# Patient Record
Sex: Male | Born: 1950 | Race: White | Hispanic: No | Marital: Single | Smoking: Never smoker
Health system: Southern US, Community
[De-identification: ages and names within clinical notes are randomized; demographics above are authoritative.]

## PROBLEM LIST (undated history)

## (undated) DIAGNOSIS — J45909 Unspecified asthma, uncomplicated: Secondary | ICD-10-CM

## (undated) HISTORY — PX: CHOLECYSTECTOMY: SHX55

## (undated) HISTORY — PX: ROTATOR CUFF REPAIR: SHX139

---

## 2021-05-09 ENCOUNTER — Emergency Department (HOSPITAL_BASED_OUTPATIENT_CLINIC_OR_DEPARTMENT_OTHER): Payer: Medicare Other

## 2021-05-09 ENCOUNTER — Other Ambulatory Visit: Payer: Self-pay

## 2021-05-09 ENCOUNTER — Encounter (HOSPITAL_BASED_OUTPATIENT_CLINIC_OR_DEPARTMENT_OTHER): Payer: Self-pay

## 2021-05-09 ENCOUNTER — Emergency Department (HOSPITAL_BASED_OUTPATIENT_CLINIC_OR_DEPARTMENT_OTHER)
Admission: EM | Admit: 2021-05-09 | Discharge: 2021-05-09 | Disposition: A | Discharge status: Home / Self Care | Payer: Medicare Other | Attending: Emergency Medicine | Admitting: Emergency Medicine

## 2021-05-09 DIAGNOSIS — J45909 Unspecified asthma, uncomplicated: Secondary | ICD-10-CM | POA: Diagnosis not present

## 2021-05-09 DIAGNOSIS — R059 Cough, unspecified: Secondary | ICD-10-CM

## 2021-05-09 DIAGNOSIS — U071 COVID-19: Secondary | ICD-10-CM | POA: Diagnosis not present

## 2021-05-09 DIAGNOSIS — R0602 Shortness of breath: Secondary | ICD-10-CM | POA: Diagnosis present

## 2021-05-09 HISTORY — DX: Unspecified asthma, uncomplicated: J45.909

## 2021-05-09 LAB — COMPREHENSIVE METABOLIC PANEL
ALT: 52 U/L — ABNORMAL HIGH (ref 0–44)
AST: 95 U/L — ABNORMAL HIGH (ref 15–41)
Albumin: 3.5 g/dL (ref 3.5–5.0)
Alkaline Phosphatase: 57 U/L (ref 38–126)
Anion gap: 11 (ref 5–15)
BUN: 21 mg/dL (ref 8–23)
CO2: 21 mmol/L — ABNORMAL LOW (ref 22–32)
Calcium: 8.1 mg/dL — ABNORMAL LOW (ref 8.9–10.3)
Chloride: 102 mmol/L (ref 98–111)
Creatinine, Ser: 0.96 mg/dL (ref 0.61–1.24)
GFR, Estimated: >60 mL/min (ref >60)
Glucose, Bld: 113 mg/dL — ABNORMAL HIGH (ref 70–99)
Potassium: 3.4 mmol/L — ABNORMAL LOW (ref 3.5–5.1)
Sodium: 134 mmol/L — ABNORMAL LOW (ref 135–145)
Total Bilirubin: 0.4 mg/dL (ref 0.3–1.2)
Total Protein: 8 g/dL (ref 6.5–8.1)

## 2021-05-09 LAB — CBC WITH DIFFERENTIAL/PLATELET
Abs Immature Granulocytes: 0.02 10*3/uL (ref 0.00–0.07)
Basophils Absolute: 0 10*3/uL (ref 0.0–0.1)
Basophils Relative: 1 %
Eosinophils Absolute: 0 10*3/uL (ref 0.0–0.5)
Eosinophils Relative: 0 %
HCT: 46.9 % (ref 39.0–52.0)
Hemoglobin: 16.5 g/dL (ref 13.0–17.0)
Immature Granulocytes: 1 %
Lymphocytes Relative: 29 %
Lymphs Abs: 1 10*3/uL (ref 0.7–4.0)
MCH: 32.9 pg (ref 26.0–34.0)
MCHC: 35.2 g/dL (ref 30.0–36.0)
MCV: 93.4 fL (ref 80.0–100.0)
Monocytes Absolute: 0.3 10*3/uL (ref 0.1–1.0)
Monocytes Relative: 8 %
Neutro Abs: 2.3 10*3/uL (ref 1.7–7.7)
Neutrophils Relative %: 61 %
Platelets: 145 10*3/uL — ABNORMAL LOW (ref 150–400)
RBC: 5.02 MIL/uL (ref 4.22–5.81)
RDW: 13.9 % (ref 11.5–15.5)
WBC: 3.6 10*3/uL — ABNORMAL LOW (ref 4.0–10.5)
nRBC: 0 % (ref 0.0–0.2)

## 2021-05-09 LAB — RESP PANEL BY RT-PCR (FLU A&B, COVID) ARPGX2
Influenza A by PCR: NEGATIVE
Influenza B by PCR: NEGATIVE
SARS Coronavirus 2 by RT PCR: POSITIVE — AB

## 2021-05-09 LAB — BRAIN NATRIURETIC PEPTIDE: B Natriuretic Peptide: 11.8 pg/mL (ref 0.0–100.0)

## 2021-05-09 MED ORDER — ALBUTEROL SULFATE HFA 108 (90 BASE) MCG/ACT IN AERS
6.0000 | INHALATION_SPRAY | Freq: Once | RESPIRATORY_TRACT | Status: AC
  Administered 2021-05-09: 6 via RESPIRATORY_TRACT
  Filled 2021-05-09: qty 6.7

## 2021-05-09 MED ORDER — BENZONATATE 200 MG PO CAPS: 200.0000 mg | ORAL_CAPSULE | Freq: Three times a day (TID) | ORAL | 0 refills | Status: DC

## 2021-05-09 MED ORDER — AEROCHAMBER PLUS FLO-VU LARGE MISC
1.0000 | Freq: Once | Status: AC
  Administered 2021-05-09: 1
  Filled 2021-05-09: qty 1

## 2021-05-09 MED ORDER — ALBUTEROL SULFATE HFA 108 (90 BASE) MCG/ACT IN AERS: 1.0000 | INHALATION_SPRAY | Freq: Four times a day (QID) | RESPIRATORY_TRACT | 0 refills | Status: DC | PRN

## 2021-05-09 NOTE — ED Notes (Signed)
RT assessed patient upon arrival to room. Stated he has been more SOB with exertion the past 5 weeks. Out of MDI's from [place]. BBS clear with diminished bases. Labored when getting into bed. SAT 94% on RA

## 2021-05-09 NOTE — ED Triage Notes (Signed)
Pt c/o SOB x 5 weeks-reports occ prod cough-states he is out of inhaler-to tx room via w/c-RT at BS-DOE from w/c to stretcher

## 2021-05-09 NOTE — Progress Notes (Signed)
Predicted PEFR is 550, and he achieved 100 x3.

## 2021-05-09 NOTE — Discharge Instructions (Addendum)
Use inhaler as needed 1 to 2 puffs every 4-6 hours. Take Tessalon as prescribed as needed for cough. Take Mucinex as needed as directed. Follow-up with your primary care provider.  You will need a repeat chest x-ray in 3 to 4 weeks. Return to the emergency room for worsening or concerning symptoms.

## 2021-05-09 NOTE — ED Provider Notes (Signed)
[facility] EMERGENCY DEPARTMENT Provider Note   CSN: [id number] Arrival date & time: 05/09/21  1243     History Chief Complaint  Patient presents with   Shortness of Breath    Thomas Hammond is a 70 y.o. male.  Thomas Hammond is a 70 year old male with a past medical history of asthma who presents today with an asthma exacerbation. 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 He has not been followed by the [facility] or a health care provider since. He states that he has been using Proventil inhalers ([facility] sent a supply after his diagnosis) for the past 5 years and has run out. He endorses a worsening dry cough at rest and with exertion for the past 5 weeks, progressively worsening to the point he just does not feel good today. He states that the cough wakes him up almost every night, reports SHOB lying supine, occasionally moves to a "pallet" of bedding on the floor. He has experienced a similar episode in the past that was not as severe and has never hospitalized for an asthma exacerbation. He denies hemoptysis,, fever and chest pain. He denies ever smoking tobacco.      Past Medical History:  Diagnosis Date   Asthma     There are no problems to display for this patient.   Past Surgical History:  Procedure Laterality Date   CHOLECYSTECTOMY     ROTATOR CUFF REPAIR         No family history on file.  Social History   Tobacco Use   Smoking status: Never   Smokeless tobacco: Never  Vaping Use   Vaping Use: Never used  Substance Use Topics   Alcohol use: Never   Drug use: Never    Home Medications Prior to Admission medications   Medication Sig Start Date End Date Taking? Authorizing Provider  albuterol (VENTOLIN HFA) 108 (90 Base) MCG/ACT inhaler Inhale 1-2 puffs into the lungs every 6 (six) hours as needed for wheezing or shortness of breath. 05/09/21  Yes Jeannie Fend, PA-C    Allergies    Patient has no known  allergies.  Review of Systems   Review of Systems  Constitutional:  Negative for chills, diaphoresis and fever.  HENT:  Negative for congestion and sore throat.   Respiratory:  Positive for cough and shortness of breath.   Cardiovascular:  Negative for chest pain, palpitations and leg swelling.  Gastrointestinal:  Negative for abdominal pain, constipation, diarrhea, nausea and vomiting.  Genitourinary:  Negative for difficulty urinating.  Musculoskeletal:  Negative for arthralgias and myalgias.  Skin:  Negative for rash and wound.  Allergic/Immunologic: Negative for immunocompromised state.  Neurological:  Negative for weakness.  Hematological:  Negative for adenopathy.  Psychiatric/Behavioral:  Negative for confusion.   All other systems reviewed and are negative.  Physical Exam Updated Vital Signs BP 125/76   Pulse 81   Temp 98.6 F (37 C) (Oral)   Resp 18   Ht 5\' 6"  (1.676 m)   Wt 110.2 kg   SpO2 95%   BMI 39.22 kg/m   Physical Exam Vitals and nursing note reviewed.  Constitutional:      General: He is not in acute distress.    Appearance: He is well-developed. He is obese. He is not diaphoretic.  HENT:     Head: Normocephalic and atraumatic.  Cardiovascular:     Rate and Rhythm: Normal rate and  regular rhythm.  Pulmonary:     Effort: Pulmonary effort is normal.     Breath sounds: Examination of the right-lower field reveals decreased breath sounds. Examination of the left-lower field reveals decreased breath sounds. Decreased breath sounds present.  Chest:     Chest wall: No tenderness.  Abdominal:     Palpations: Abdomen is soft.     Tenderness: There is no abdominal tenderness.  Musculoskeletal:     Right lower leg: No tenderness. No edema.     Left lower leg: No tenderness. No edema.  Skin:    General: Skin is warm and dry.     Findings: No erythema or rash.  Neurological:     Mental Status: He is alert and oriented to person, place, and time.   Psychiatric:        Behavior: Behavior normal.    ED Results / Procedures / Treatments   Labs (all labs ordered are listed, but only abnormal results are displayed) Labs Reviewed  RESP PANEL BY RT-PCR (FLU A&B, COVID) ARPGX2 - Abnormal; Notable for the following components:      Result Value   SARS Coronavirus 2 by RT PCR POSITIVE (*)    All other components within normal limits  COMPREHENSIVE METABOLIC PANEL - Abnormal; Notable for the following components:   Sodium 134 (*)    Potassium 3.4 (*)    CO2 21 (*)    Glucose, Bld 113 (*)    Calcium 8.1 (*)    AST 95 (*)    ALT 52 (*)    All other components within normal limits  CBC WITH DIFFERENTIAL/PLATELET - Abnormal; Notable for the following components:   WBC 3.6 (*)    Platelets 145 (*)    All other components within normal limits  BRAIN NATRIURETIC PEPTIDE    EKG EKG Interpretation  Date/Time:  Wednesday May 09 2021 14:37:01 EDT Ventricular Rate:  79 PR Interval:  167 QRS Duration: 95 QT Interval:  487 QTC Calculation: 559 R Axis:   65 Text Interpretation: Sinus rhythm Low voltage, precordial leads Borderline repolarization abnormality Prolonged QT interval Confirmed by Tilden Fossa [phone]) on 05/09/2021 2:42:53 PM  Radiology DG Chest Port 1 View  Result Date: 05/09/2021 CLINICAL DATA:  Shortness of breath with exertion EXAM: PORTABLE CHEST 1 VIEW COMPARISON:  None. FINDINGS: Enlarged cardiac silhouette, likely accentuated by AP technique. Ill-defined interstitial and airspace opacities at the lateral left greater than right lung bases. No large pleural effusion or visible pneumothorax on this single semi erect radiograph. IMPRESSION: 1. Ill-defined interstitial and patchy airspace opacities in the peripheral left greater than right lung bases, potentially pneumonia. Consider correlation with COVID testing. Followup PA and lateral chest X-ray is recommended in 3-4 weeks to ensure resolution. 2. Cardiomegaly.  Electronically Signed   By: Feliberto Harts MD   On: 05/09/2021 14:05    Procedures Procedures   Medications Ordered in ED Medications  AeroChamber Plus Flo-Vu Large MISC 1 each (has no administration in time range)  albuterol (VENTOLIN HFA) 108 (90 Base) MCG/ACT inhaler 6 puff (6 puffs Inhalation Given 05/09/21 1630)    ED Course  I have reviewed the triage vital signs and the nursing notes.  Pertinent labs & imaging results that were available during my care of the patient were reviewed by me and considered in my medical decision making (see chart for details).  Clinical Course as of 05/09/21 1631  Wed May 09, 2021  4835 70 year old male with history of asthma presents  with complaint of shortness of breath with dry cough progressively worsening over the past 5 weeks.  Reports dyspnea on exertion as well as orthopnea.  Denies lower extremity swelling or chest pain. Found to have slightly diminished lung sounds at the bases, no lower extremity edema. Records reviewed, prior peak flow in the low 400s, unchanged today.  O2 sats 92% or greater with ambulation. Chest x-ray shows ill-defined interstitial and patchy airspace opacities in the peripheral left greater than right lung bases, potentially pneumonia, recommends correlation with COVID testing, as well as cardiomegaly. [facility] the past 5 years due to frustration with the [facility] billing system. Plan is to check EKG, add on labs including BNP for potential CHF with reports of DOE and orthopnea.  We will also add COVID test. [LM]  1630 COVID-positive.  States that for the past week his symptoms have been worse, unable to narrow down within the past 5 days to be candidate for Paxlovid. CBC with white blood cell count of 3.6, platelets 145, CMP with mildly elevated LFTs, consistent with COVID infection. Plan is to give albuterol inhaler with spacer prior to discharge with prescription for same.  Strongly  encourage patient to follow-up with a primary care provider, advised she will need a repeat chest x-ray in 3 to 4 weeks.  Given return to ER precautions.  BNP normal at 11.8, doubt CHF. [LM]    Clinical Course User Index [LM] Alden Hipp   MDM Rules/Calculators/A&P                            Final Clinical Impression(s) / ED Diagnoses Final diagnoses:  COVID    Rx / DC Orders ED Discharge Orders          Ordered    albuterol (VENTOLIN HFA) 108 (90 Base) MCG/ACT inhaler  Every 6 hours PRN        05/09/21 1628             Jeannie Fend, PA-C 05/09/21 1632    Rolan Bucco, MD 05/10/21 0840

## 2021-05-09 NOTE — ED Notes (Signed)
Pulse ox with ambulation to the bathroom. SAT 92-93%. PA aware.

## 2021-05-13 ENCOUNTER — Emergency Department (HOSPITAL_BASED_OUTPATIENT_CLINIC_OR_DEPARTMENT_OTHER): Payer: Medicare Other

## 2021-05-13 ENCOUNTER — Encounter (HOSPITAL_BASED_OUTPATIENT_CLINIC_OR_DEPARTMENT_OTHER): Payer: Self-pay | Admitting: Emergency Medicine

## 2021-05-13 ENCOUNTER — Inpatient Hospital Stay (HOSPITAL_BASED_OUTPATIENT_CLINIC_OR_DEPARTMENT_OTHER)
Admission: EM | Admit: 2021-05-13 | Discharge: 2021-05-27 | DRG: 177 | Disposition: A | Discharge status: Home / Self Care | Payer: Medicare Other | Attending: Family Medicine | Admitting: Family Medicine

## 2021-05-13 DIAGNOSIS — J1282 Pneumonia due to coronavirus disease 2019: Secondary | ICD-10-CM | POA: Diagnosis present

## 2021-05-13 DIAGNOSIS — R0902 Hypoxemia: Secondary | ICD-10-CM | POA: Diagnosis not present

## 2021-05-13 DIAGNOSIS — Z79899 Other long term (current) drug therapy: Secondary | ICD-10-CM

## 2021-05-13 DIAGNOSIS — I959 Hypotension, unspecified: Secondary | ICD-10-CM | POA: Diagnosis not present

## 2021-05-13 DIAGNOSIS — R0602 Shortness of breath: Secondary | ICD-10-CM

## 2021-05-13 DIAGNOSIS — U071 COVID-19: Secondary | ICD-10-CM | POA: Diagnosis not present

## 2021-05-13 DIAGNOSIS — Z66 Do not resuscitate: Secondary | ICD-10-CM | POA: Diagnosis present

## 2021-05-13 DIAGNOSIS — E871 Hypo-osmolality and hyponatremia: Secondary | ICD-10-CM | POA: Diagnosis present

## 2021-05-13 DIAGNOSIS — E119 Type 2 diabetes mellitus without complications: Secondary | ICD-10-CM | POA: Diagnosis present

## 2021-05-13 DIAGNOSIS — J4521 Mild intermittent asthma with (acute) exacerbation: Secondary | ICD-10-CM | POA: Diagnosis present

## 2021-05-13 DIAGNOSIS — J9601 Acute respiratory failure with hypoxia: Secondary | ICD-10-CM | POA: Diagnosis present

## 2021-05-13 LAB — CBC WITH DIFFERENTIAL/PLATELET
Abs Immature Granulocytes: 0.04 10*3/uL (ref 0.00–0.07)
Basophils Absolute: 0 10*3/uL (ref 0.0–0.1)
Basophils Relative: 0 %
Eosinophils Absolute: 0 10*3/uL (ref 0.0–0.5)
Eosinophils Relative: 0 %
HCT: 46.2 % (ref 39.0–52.0)
Hemoglobin: 16.3 g/dL (ref 13.0–17.0)
Immature Granulocytes: 1 %
Lymphocytes Relative: 8 %
Lymphs Abs: 0.6 10*3/uL — ABNORMAL LOW (ref 0.7–4.0)
MCH: 32.7 pg (ref 26.0–34.0)
MCHC: 35.3 g/dL (ref 30.0–36.0)
MCV: 92.8 fL (ref 80.0–100.0)
Monocytes Absolute: 0.3 10*3/uL (ref 0.1–1.0)
Monocytes Relative: 4 %
Neutro Abs: 6.2 10*3/uL (ref 1.7–7.7)
Neutrophils Relative %: 87 %
Platelets: 260 10*3/uL (ref 150–400)
RBC: 4.98 MIL/uL (ref 4.22–5.81)
RDW: 13.8 % (ref 11.5–15.5)
WBC: 7.1 10*3/uL (ref 4.0–10.5)
nRBC: 0 % (ref 0.0–0.2)

## 2021-05-13 LAB — BASIC METABOLIC PANEL
Anion gap: 11 (ref 5–15)
BUN: 24 mg/dL — ABNORMAL HIGH (ref 8–23)
CO2: 24 mmol/L (ref 22–32)
Calcium: 8.5 mg/dL — ABNORMAL LOW (ref 8.9–10.3)
Chloride: 99 mmol/L (ref 98–111)
Creatinine, Ser: 1.08 mg/dL (ref 0.61–1.24)
GFR, Estimated: >60 mL/min (ref >60)
Glucose, Bld: 152 mg/dL — ABNORMAL HIGH (ref 70–99)
Potassium: 3.7 mmol/L (ref 3.5–5.1)
Sodium: 134 mmol/L — ABNORMAL LOW (ref 135–145)

## 2021-05-13 LAB — TROPONIN I (HIGH SENSITIVITY): Troponin I (High Sensitivity): 7 ng/L (ref <18)

## 2021-05-13 LAB — D-DIMER, QUANTITATIVE: D-Dimer, Quant: 3.81 ug/mL-FEU — ABNORMAL HIGH (ref 0.00–0.50)

## 2021-05-13 MED ORDER — SODIUM CHLORIDE 0.9 % IV SOLN
100.0000 mg | Freq: Every day | INTRAVENOUS | Status: DC
  Administered 2021-05-15 – 2021-05-17 (×3): 100 mg via INTRAVENOUS
  Filled 2021-05-13 (×3): qty 20

## 2021-05-13 MED ORDER — SODIUM CHLORIDE 0.9 % IV SOLN
100.0000 mg | INTRAVENOUS | Status: AC
  Administered 2021-05-14 (×2): 100 mg via INTRAVENOUS

## 2021-05-13 MED ORDER — ALBUTEROL SULFATE HFA 108 (90 BASE) MCG/ACT IN AERS
8.0000 | INHALATION_SPRAY | Freq: Once | RESPIRATORY_TRACT | Status: AC
  Administered 2021-05-13: 8 via RESPIRATORY_TRACT
  Filled 2021-05-13: qty 6.7

## 2021-05-13 MED ORDER — METHYLPREDNISOLONE SODIUM SUCC 125 MG IJ SOLR
125.0000 mg | Freq: Once | INTRAMUSCULAR | Status: AC
  Administered 2021-05-13: 125 mg via INTRAVENOUS
  Filled 2021-05-13: qty 2

## 2021-05-13 MED ORDER — IOHEXOL 350 MG/ML SOLN
100.0000 mL | Freq: Once | INTRAVENOUS | Status: AC | PRN
  Administered 2021-05-13: 100 mL via INTRAVENOUS

## 2021-05-13 NOTE — ED Triage Notes (Signed)
Pt reports increased difficulty breathing; dx with Covid 05/09/21; current RA sat 87%, unable to speak in complete sentences

## 2021-05-13 NOTE — Plan of Care (Signed)
[facility] will assume care on arrival to accepting facility. Until arrival, care as per EDP. However, [facility] available 24/7 for questions and assistance.  

## 2021-05-13 NOTE — ED Provider Notes (Signed)
[facility] EMERGENCY DEPARTMENT Provider Note   CSN: [id number] Arrival date & time: 05/13/21  2107     History Chief Complaint  Patient presents with   Shortness of Breath    Thomas Hammond is a 70 y.o. male.  The history is provided by the patient and medical records.  Shortness of Breath Thomas Hammond is a 70 y.o. male who presents to the Emergency Department complaining of shortness of breath. He presents the emergency department complaining of shortness of breath. He states that this started five weeks ago. He reports cough, that is minimally productive. No chest pain, hemoptysis, nausea, vomiting, abdominal pain. He was seen in the emergency department a few days ago for similar symptoms and was diagnosed with COVID-19 at that time. He states that he is not feeling any better since hospital discharge. He does state that he has been compliant with prescribed medications.    Past Medical History:  Diagnosis Date   Asthma     Patient Active Problem List   Diagnosis Date Noted   Pneumonia due to COVID-19 virus 05/13/2021    Past Surgical History:  Procedure Laterality Date   CHOLECYSTECTOMY     ROTATOR CUFF REPAIR         No family history on file.  Social History   Tobacco Use   Smoking status: Never   Smokeless tobacco: Never  Vaping Use   Vaping Use: Never used  Substance Use Topics   Alcohol use: Never   Drug use: Never    Home Medications Prior to Admission medications   Medication Sig Start Date End Date Taking? Authorizing Provider  albuterol (VENTOLIN HFA) 108 (90 Base) MCG/ACT inhaler Inhale 1-2 puffs into the lungs every 6 (six) hours as needed for wheezing or shortness of breath. 05/09/21   Jeannie Fend, PA-C  benzonatate (TESSALON) 200 MG capsule Take 1 capsule (200 mg total) by mouth every 8 (eight) hours for 10 days. 05/09/21 05/19/21  Jeannie Fend, PA-C    Allergies    Patient has no known allergies.  Review of Systems   Review  of Systems  Respiratory:  Positive for shortness of breath.   All other systems reviewed and are negative.  Physical Exam Updated Vital Signs BP 125/77 Comment: HFNC 8L  Pulse 84 Comment: HFNC 8L  Temp 99.8 F (37.7 C) (Oral)   Resp (!) 22   Ht 5\' 6"  (1.676 m)   Wt 110.2 kg   SpO2 93% Comment: HFNC 8L  BMI 39.22 kg/m   Physical Exam Vitals and nursing note reviewed.  Constitutional:      Appearance: He is well-developed.  HENT:     Head: Normocephalic and atraumatic.  Cardiovascular:     Rate and Rhythm: Normal rate and regular rhythm.     Heart sounds: No murmur heard. Pulmonary:     Effort: Pulmonary effort is normal. No respiratory distress.     Comments: Decreased air movement in upper lung fields.  Occasional wheeze in the bases Abdominal:     Palpations: Abdomen is soft.     Tenderness: There is no abdominal tenderness. There is no guarding or rebound.  Musculoskeletal:        General: No swelling or tenderness.  Skin:    General: Skin is warm and dry.  Neurological:     Mental Status: He is alert and oriented to person, place, and time.  Psychiatric:        Behavior: Behavior normal.  ED Results / Procedures / Treatments   Labs (all labs ordered are listed, but only abnormal results are displayed) Labs Reviewed  BASIC METABOLIC PANEL - Abnormal; Notable for the following components:      Result Value   Sodium 134 (*)    Glucose, Bld 152 (*)    BUN 24 (*)    Calcium 8.5 (*)    All other components within normal limits  CBC WITH DIFFERENTIAL/PLATELET - Abnormal; Notable for the following components:   Lymphs Abs 0.6 (*)    All other components within normal limits  D-DIMER, QUANTITATIVE - Abnormal; Notable for the following components:   D-Dimer, Quant 3.81 (*)    All other components within normal limits  TROPONIN I (HIGH SENSITIVITY)  TROPONIN I (HIGH SENSITIVITY)    EKG None  Radiology CT Angio Chest PE W/Cm &/Or Wo Cm  Result Date:  05/13/2021 CLINICAL DATA:  Pulmonary embolus suspected with low to intermediate probability. Positive D-dimer. EXAM: CT ANGIOGRAPHY CHEST WITH CONTRAST TECHNIQUE: Multidetector CT imaging of the chest was performed using the standard protocol during bolus administration of intravenous contrast. Multiplanar CT image reconstructions and MIPs were obtained to evaluate the vascular anatomy. CONTRAST:  OMNIPAQUE IOHEXOL 350 MG/ML SOLN COMPARISON:  Chest radiograph 05/13/2021 FINDINGS: Cardiovascular: Good opacification of the central and segmental pulmonary arteries. No focal filling defects. No evidence of significant pulmonary embolus. Motion artifact limits evaluation of the lower lungs. Normal heart size. No pericardial effusions. Normal caliber thoracic aorta. No aortic dissection. Great vessel origins are patent. Mediastinum/Nodes: Esophagus is decompressed. Small esophageal hiatal hernia. Scattered calcified lymph nodes. No significant lymphadenopathy. Thyroid gland is unremarkable. Lungs/Pleura: Diffuse distribution of somewhat patchy airspace disease throughout both lungs with a mostly peripheral distribution pattern. Changes likely to represent multifocal pneumonia. No pleural effusions. No pneumothorax. Upper Abdomen: Surgical absence of the gallbladder. No acute abnormalities demonstrated in the visualized upper abdomen. Musculoskeletal: Degenerative changes in the spine. No destructive bone lesions. Review of the MIP images confirms the above findings. IMPRESSION: 1. No evidence of significant pulmonary embolus. 2. Diffuse airspace disease throughout the lungs with peripheral distribution. Most likely represents multifocal pneumonia. Electronically Signed   By: Burman Nieves M.D.   On: 05/13/2021 23:45   DG Chest Port 1 View  Result Date: 05/13/2021 CLINICAL DATA:  Short of breath for 6 weeks. EXAM: PORTABLE CHEST 1 VIEW COMPARISON:  05/09/2021. FINDINGS: Cardiac silhouette is borderline  enlarged. No mediastinal or hilar masses. Hazy opacities noted in the mid to lower lung zones with associated interstitial prominence. Findings are similar to the prior study. No new lung abnormalities. No convincing pleural effusion and no pneumothorax. Previous left shoulder rotator cuff surgery. Skeletal structures grossly intact. IMPRESSION: 1. No interval change from the study performed 4 days ago. 2. Subtle hazy opacities in the mid to lower lungs, most evident at the left lung base, which may reflect multifocal pneumonia with atypical etiologies including viral infection in the differential. Patient reportedly did test positive for COVID-19. Electronically Signed   By: Amie Portland M.D.   On: 05/13/2021 22:34    Procedures Procedures  CRITICAL CARE Performed by: Tilden Fossa   Total critical care time: 40 minutes  Critical care time was exclusive of separately billable procedures and treating other patients.  Critical care was necessary to treat or prevent imminent or life-threatening deterioration.  Critical care was time spent personally by me on the following activities: development of treatment plan with patient and/or surrogate as well  as nursing, discussions with consultants, evaluation of patient's response to treatment, examination of patient, obtaining history from patient or surrogate, ordering and performing treatments and interventions, ordering and review of laboratory studies, ordering and review of radiographic studies, pulse oximetry and re-evaluation of patient's condition.  Medications Ordered in ED Medications  remdesivir 100 mg in sodium chloride 0.9 % 100 mL IVPB (has no administration in time range)  remdesivir 100 mg in sodium chloride 0.9 % 100 mL IVPB (has no administration in time range)  methylPREDNISolone sodium succinate (SOLU-MEDROL) 125 mg/2 mL injection 125 mg (125 mg Intravenous Given 05/13/21 2208)  albuterol (VENTOLIN HFA) 108 (90 Base) MCG/ACT inhaler  8 puff (8 puffs Inhalation Given 05/13/21 2216)  iohexol (OMNIPAQUE) 350 MG/ML injection 100 mL (100 mLs Intravenous Contrast Given 05/13/21 2338)    ED Course  I have reviewed the triage vital signs and the nursing notes.  Pertinent labs & imaging results that were available during my care of the patient were reviewed by me and considered in my medical decision making (see chart for details).    MDM Rules/Calculators/A&P                         Patient with history of asthma, recently diagnosed with COVID-19 here for evaluation of shortness of breath and cough. He does have decreased air movement and wheezing on evaluation without respiratory distress. He does have significant oxygen requirement up to 6 L nasal cannula to maintain sats of the low 90s. He was treated with albuterol with no significant change in his lung exam. Chest x-ray with multifocal pneumonia, likely viral in origin based on his history and symptoms. His D dimer was elevated and a CTA was obtained, which is negative for PE. Medicine consulted for admission for ongoing treatment for COVID-19 with hypoxia. Patient updated findings of studies recommendation for mission and he is in agreement with plan. Remdesivir started per hospitalist request.  Final Clinical Impression(s) / ED Diagnoses Final diagnoses:  Pneumonia due to COVID-19 virus  Hypoxia    Rx / DC Orders ED Discharge Orders     None        Tilden Fossa, MD 05/14/21 0002

## 2021-05-13 NOTE — ED Notes (Signed)
Placed patient on HFNC [place] @ 6L FIO2 100%. Patient oxygen saturations of 92% Patient tolerating well.

## 2021-05-14 ENCOUNTER — Other Ambulatory Visit: Payer: Self-pay

## 2021-05-14 ENCOUNTER — Encounter (HOSPITAL_COMMUNITY): Payer: Self-pay | Admitting: Internal Medicine

## 2021-05-14 DIAGNOSIS — Z66 Do not resuscitate: Secondary | ICD-10-CM | POA: Diagnosis present

## 2021-05-14 DIAGNOSIS — J1282 Pneumonia due to coronavirus disease 2019: Secondary | ICD-10-CM | POA: Diagnosis present

## 2021-05-14 DIAGNOSIS — U071 COVID-19: Secondary | ICD-10-CM | POA: Diagnosis present

## 2021-05-14 DIAGNOSIS — R7989 Other specified abnormal findings of blood chemistry: Secondary | ICD-10-CM | POA: Diagnosis not present

## 2021-05-14 DIAGNOSIS — E119 Type 2 diabetes mellitus without complications: Secondary | ICD-10-CM | POA: Diagnosis present

## 2021-05-14 DIAGNOSIS — R0902 Hypoxemia: Secondary | ICD-10-CM | POA: Diagnosis present

## 2021-05-14 DIAGNOSIS — J9601 Acute respiratory failure with hypoxia: Secondary | ICD-10-CM | POA: Diagnosis present

## 2021-05-14 DIAGNOSIS — I959 Hypotension, unspecified: Secondary | ICD-10-CM | POA: Diagnosis not present

## 2021-05-14 DIAGNOSIS — J4521 Mild intermittent asthma with (acute) exacerbation: Secondary | ICD-10-CM | POA: Diagnosis present

## 2021-05-14 DIAGNOSIS — E871 Hypo-osmolality and hyponatremia: Secondary | ICD-10-CM | POA: Diagnosis present

## 2021-05-14 DIAGNOSIS — Z79899 Other long term (current) drug therapy: Secondary | ICD-10-CM | POA: Diagnosis not present

## 2021-05-14 LAB — CBC
HCT: 45.5 % (ref 39.0–52.0)
Hemoglobin: 15.7 g/dL (ref 13.0–17.0)
MCH: 32.6 pg (ref 26.0–34.0)
MCHC: 34.5 g/dL (ref 30.0–36.0)
MCV: 94.6 fL (ref 80.0–100.0)
Platelets: 252 10*3/uL (ref 150–400)
RBC: 4.81 MIL/uL (ref 4.22–5.81)
RDW: 14.1 % (ref 11.5–15.5)
WBC: 9.6 10*3/uL (ref 4.0–10.5)
nRBC: 0 % (ref 0.0–0.2)

## 2021-05-14 LAB — MRSA NEXT GEN BY PCR, NASAL: MRSA by PCR Next Gen: NOT DETECTED

## 2021-05-14 LAB — PROCALCITONIN: Procalcitonin: 0.63 ng/mL

## 2021-05-14 LAB — TROPONIN I (HIGH SENSITIVITY): Troponin I (High Sensitivity): 6 ng/L (ref <18)

## 2021-05-14 LAB — CREATININE, SERUM
Creatinine, Ser: 0.9 mg/dL (ref 0.61–1.24)
GFR, Estimated: >60 mL/min (ref >60)

## 2021-05-14 LAB — HIV ANTIBODY (ROUTINE TESTING W REFLEX): HIV Screen 4th Generation wRfx: NONREACTIVE

## 2021-05-14 MED ORDER — ENOXAPARIN SODIUM 60 MG/0.6ML IJ SOSY
55.0000 mg | PREFILLED_SYRINGE | INTRAMUSCULAR | Status: DC
  Administered 2021-05-14: 55 mg via SUBCUTANEOUS
  Filled 2021-05-14: qty 0.55

## 2021-05-14 MED ORDER — HYDROCOD POLST-CPM POLST ER 10-8 MG/5ML PO SUER
5.0000 mL | Freq: Two times a day (BID) | ORAL | Status: DC | PRN
  Administered 2021-05-14 – 2021-05-17 (×4): 5 mL via ORAL
  Filled 2021-05-14 (×4): qty 5

## 2021-05-14 MED ORDER — ASCORBIC ACID 500 MG PO TABS
500.0000 mg | ORAL_TABLET | Freq: Every day | ORAL | Status: DC
  Administered 2021-05-14 – 2021-05-27 (×14): 500 mg via ORAL
  Filled 2021-05-14 (×14): qty 1

## 2021-05-14 MED ORDER — CHLORHEXIDINE GLUCONATE CLOTH 2 % EX PADS
6.0000 | MEDICATED_PAD | Freq: Every day | CUTANEOUS | Status: DC
  Administered 2021-05-14 – 2021-05-21 (×8): 6 via TOPICAL

## 2021-05-14 MED ORDER — GUAIFENESIN-DM 100-10 MG/5ML PO SYRP
10.0000 mL | ORAL_SOLUTION | ORAL | Status: DC | PRN
Start: 2021-05-14 — End: 2021-05-18
  Administered 2021-05-15 – 2021-05-18 (×6): 10 mL via ORAL
  Filled 2021-05-14 (×6): qty 10

## 2021-05-14 MED ORDER — ALBUTEROL SULFATE HFA 108 (90 BASE) MCG/ACT IN AERS
8.0000 | INHALATION_SPRAY | RESPIRATORY_TRACT | Status: DC | PRN
  Administered 2021-05-14 – 2021-05-16 (×2): 8 via RESPIRATORY_TRACT
  Filled 2021-05-14: qty 6.7

## 2021-05-14 MED ORDER — ACETAMINOPHEN 325 MG PO TABS: 650.0000 mg | ORAL_TABLET | Freq: Four times a day (QID) | ORAL | Status: DC | PRN

## 2021-05-14 MED ORDER — ORAL CARE MOUTH RINSE
15.0000 mL | Freq: Two times a day (BID) | OROMUCOSAL | Status: DC
  Administered 2021-05-14 – 2021-05-27 (×25): 15 mL via OROMUCOSAL

## 2021-05-14 MED ORDER — BARICITINIB 2 MG PO TABS
4.0000 mg | ORAL_TABLET | Freq: Every day | ORAL | Status: AC
  Administered 2021-05-14 – 2021-05-27 (×14): 4 mg via ORAL
  Filled 2021-05-14 (×14): qty 2

## 2021-05-14 MED ORDER — IPRATROPIUM-ALBUTEROL 20-100 MCG/ACT IN AERS
1.0000 | INHALATION_SPRAY | Freq: Four times a day (QID) | RESPIRATORY_TRACT | Status: DC
  Administered 2021-05-14 – 2021-05-24 (×37): 1 via RESPIRATORY_TRACT
  Filled 2021-05-14 (×2): qty 4

## 2021-05-14 MED ORDER — METHYLPREDNISOLONE SODIUM SUCC 125 MG IJ SOLR
1.0000 mg/kg | Freq: Two times a day (BID) | INTRAMUSCULAR | Status: AC
  Administered 2021-05-14 – 2021-05-17 (×6): 110 mg via INTRAVENOUS
  Filled 2021-05-14 (×6): qty 2

## 2021-05-14 MED ORDER — ZINC SULFATE 220 (50 ZN) MG PO CAPS
220.0000 mg | ORAL_CAPSULE | Freq: Every day | ORAL | Status: DC
  Administered 2021-05-14 – 2021-05-27 (×14): 220 mg via ORAL
  Filled 2021-05-14 (×14): qty 1

## 2021-05-14 MED ORDER — PREDNISONE 10 MG PO TABS
50.0000 mg | ORAL_TABLET | Freq: Every day | ORAL | Status: DC
  Administered 2021-05-18 – 2021-05-20 (×3): 50 mg via ORAL
  Filled 2021-05-14 (×3): qty 2

## 2021-05-14 NOTE — Progress Notes (Signed)
Patient arrives via EMS on stretcher around 1500 from [facility]. He was on HFNC at 10L and was placed on a Selter HFNC at 10L on arrival to the unit. CHG completed and unit orientation done. Patient educated on call light use to reduce falls and injury during hospitalization. Verbalized understanding of education. Patient was noted to be dropping his oxygen saturation levels with any exertional activity, including even speaking for greater than 2 sentences.   At approx 1700 his o2 was increased to 15L via HFNC selter, and his spo2 continued to fluctuate between 87-91%. RT was notified, and it was recommended to place the patient on a Non-rebreather in addition to the non-heated high flow. After 5 min, the patient's spo2 increased to 94-95%. Work of breathing decreased and patient now appears more comfortable, sitting in the recliner chair in an upright position. Will attempt to titrate as tolerated.

## 2021-05-14 NOTE — ED Notes (Signed)
Called [facility] for report, was told RN would call back after they clarify what room he is going to.

## 2021-05-14 NOTE — H&P (Signed)
History and Physical    Thomas Hammond KCL:[id number] DOB: 08-Apr-1951 DOA: 05/13/2021  PCP: Pcp, No  Patient coming from: Home  Chief Complaint: dyspnea  HPI: Thomas Hammond is a 70 y.o. male with medical history significant of asthma. Presenting with dyspnea. His symptoms began about 5 weeks ago. He had a dry cough and difficulty catching his breath. His symptoms persisted and seemed to worsen at the end of last week when he was having difficulty maintaining sleep. His cough was keeping him up at night. He visited to the ED and was told that he was positive for COVID. He was told he was outside the window for paxlovid use and was given a prescription for an albuterol inhaler w/ spacer. He went him and tried to use the inhaler, but his symptoms worsened over the last couple of days. He decided he needed to return to the ED for evaluation.  ED Course: He was found to be significantly hypoxic. He was started on remdes, steroids, albuterol. A CTA PE was obtained. It was negative for PE. TR[facility] was called for admission.   Review of Systems: Review of systems is otherwise negative for all not mentioned in HPI.   PMHx Past Medical History:  Diagnosis Date   Asthma     PSHx Past Surgical History:  Procedure Laterality Date   CHOLECYSTECTOMY     ROTATOR CUFF REPAIR      SocHx  reports that he has never smoked. He has never used smokeless tobacco. He reports that he does not drink alcohol and does not use drugs.  No Known Allergies  FamHx Reviewed, non-contributory.  Prior to Admission medications   Medication Sig Start Date End Date Taking? Authorizing Provider  albuterol (VENTOLIN HFA) 108 (90 Base) MCG/ACT inhaler Inhale 1-2 puffs into the lungs every 6 (six) hours as needed for wheezing or shortness of breath. 05/09/21   Jeannie Fend, PA-C  benzonatate (TESSALON) 200 MG capsule Take 1 capsule (200 mg total) by mouth every 8 (eight) hours for 10 days. 05/09/21 05/19/21  Jeannie Fend, PA-C     Physical Exam: Vitals:   05/14/21 1230 05/14/21 1239 05/14/21 1300 05/14/21 1337  BP: 99/64 99/64 103/90 124/72  Pulse: 66 62 71 70  Resp: (!) 22 (!) 22 (!) 24   Temp:      TempSrc:      SpO2: 93% 93% 93% 92%  Weight:      Height:        General: 70 y.o. male resting in bed in NAD Eyes: PERRL, normal sclera ENMT: Nares patent w/o discharge, orophaynx clear, dentition normal, ears w/o discharge/lesions/ulcers Neck: Supple, trachea midline Cardiovascular: RRR, +S1, S2, no m/g/r, equal pulses throughout Respiratory: scattered wheeze, increased WOB on 10L HFNC GI: BS+, NDNT, no masses noted, no organomegaly noted MSK: No e/c/c Skin: No rashes, bruises, ulcerations noted Neuro: A&O x 3, no focal deficits Psyc: Appropriate interaction and affect, calm/cooperative  Labs on Admission: I have personally reviewed following labs and imaging studies  CBC: Recent Labs  Lab 05/09/21 1433 05/13/21 2152  WBC 3.6* 7.1  NEUTROABS 2.3 6.2  HGB 16.5 16.3  HCT 46.9 46.2  MCV 93.4 92.8  PLT 145* 260   Basic Metabolic Panel: Recent Labs  Lab 05/09/21 1433 05/13/21 2152  NA 134* 134*  K 3.4* 3.7  CL 102 99  CO2 21* 24  GLUCOSE 113* 152*  BUN 21 24*  CREATININE 0.96 1.08  CALCIUM 8.1* 8.5*   GFR: Estimated  Creatinine Clearance: 75.2 mL/min (by C-G formula based on SCr of 1.08 mg/dL). Liver Function Tests: Recent Labs  Lab 05/09/21 1433  AST 95*  ALT 52*  ALKPHOS 57  BILITOT 0.4  PROT 8.0  ALBUMIN 3.5   No results for input(s): LIPASE, AMYLASE in the last 168 hours. No results for input(s): AMMONIA in the last 168 hours. Coagulation Profile: No results for input(s): INR, PROTIME in the last 168 hours. Cardiac Enzymes: No results for input(s): CKTOTAL, CKMB, CKMBINDEX, TROPONINI in the last 168 hours. BNP (last 3 results) No results for input(s): PROBNP in the last 8760 hours. HbA1C: No results for input(s): HGBA1C in the last 72 hours. CBG: No results for  input(s): GLUCAP in the last 168 hours. Lipid Profile: No results for input(s): CHOL, HDL, LDLCALC, TRIG, CHOLHDL, LDLDIRECT in the last 72 hours. Thyroid Function Tests: No results for input(s): TSH, T4TOTAL, FREET4, T3FREE, THYROIDAB in the last 72 hours. Anemia Panel: No results for input(s): VITAMINB12, FOLATE, FERRITIN, TIBC, IRON, RETICCTPCT in the last 72 hours. Urine analysis: No results found for: COLORURINE, APPEARANCEUR, LABSPEC, PHURINE, GLUCOSEU, HGBUR, BILIRUBINUR, KETONESUR, PROTEINUR, UROBILINOGEN, NITRITE, LEUKOCYTESUR  Radiological Exams on Admission: CT Angio Chest PE W/Cm &/Or Wo Cm  Result Date: 05/13/2021 CLINICAL DATA:  Pulmonary embolus suspected with low to intermediate probability. Positive D-dimer. EXAM: CT ANGIOGRAPHY CHEST WITH CONTRAST TECHNIQUE: Multidetector CT imaging of the chest was performed using the standard protocol during bolus administration of intravenous contrast. Multiplanar CT image reconstructions and MIPs were obtained to evaluate the vascular anatomy. CONTRAST:  OMNIPAQUE IOHEXOL 350 MG/ML SOLN COMPARISON:  Chest radiograph 05/13/2021 FINDINGS: Cardiovascular: Good opacification of the central and segmental pulmonary arteries. No focal filling defects. No evidence of significant pulmonary embolus. Motion artifact limits evaluation of the lower lungs. Normal heart size. No pericardial effusions. Normal caliber thoracic aorta. No aortic dissection. Great vessel origins are patent. Mediastinum/Nodes: Esophagus is decompressed. Small esophageal hiatal hernia. Scattered calcified lymph nodes. No significant lymphadenopathy. Thyroid gland is unremarkable. Lungs/Pleura: Diffuse distribution of somewhat patchy airspace disease throughout both lungs with a mostly peripheral distribution pattern. Changes likely to represent multifocal pneumonia. No pleural effusions. No pneumothorax. Upper Abdomen: Surgical absence of the gallbladder. No acute abnormalities  demonstrated in the visualized upper abdomen. Musculoskeletal: Degenerative changes in the spine. No destructive bone lesions. Review of the MIP images confirms the above findings. IMPRESSION: 1. No evidence of significant pulmonary embolus. 2. Diffuse airspace disease throughout the lungs with peripheral distribution. Most likely represents multifocal pneumonia. Electronically Signed   By: Burman Nieves M.D.   On: 05/13/2021 23:45   DG Chest Port 1 View  Result Date: 05/13/2021 CLINICAL DATA:  Short of breath for 6 weeks. EXAM: PORTABLE CHEST 1 VIEW COMPARISON:  05/09/2021. FINDINGS: Cardiac silhouette is borderline enlarged. No mediastinal or hilar masses. Hazy opacities noted in the mid to lower lung zones with associated interstitial prominence. Findings are similar to the prior study. No new lung abnormalities. No convincing pleural effusion and no pneumothorax. Previous left shoulder rotator cuff surgery. Skeletal structures grossly intact. IMPRESSION: 1. No interval change from the study performed 4 days ago. 2. Subtle hazy opacities in the mid to lower lungs, most evident at the left lung base, which may reflect multifocal pneumonia with atypical etiologies including viral infection in the differential. Patient reportedly did test positive for COVID-19. Electronically Signed   By: Amie Portland M.D.   On: 05/13/2021 22:34    EKG: None obtained in ED.  Assessment/Plan Acute hypoxic respiratory failure COVID 19     - admitted to inpt, SDU     - continue solumedrol, remdes; add baricitinib, anti-tussives, IS, flutter, vitamins, combivent     - prone when able     - on heated HFNC 10L, wean as able     - follow inflammatory markers  Hyponatremia     - mild, follow  DVT prophylaxis: lovenox  Code Status: FULL  Family Communication: None at bedside  Consults called: None   Status is: Inpatient  Remains inpatient appropriate because:Inpatient level of care appropriate due to severity  of illness  Dispo: The patient is from: Home              Anticipated d/c is to: Home              Patient currently is not medically stable to d/c.   Difficult to place patient No  Time spent coordinating admission: 70 minutes  Akina Maish A Charlette Hennings DO [facility]  If 7PM-7AM, please contact night-coverage [email or web address]  05/14/2021, 2:28 PM

## 2021-05-15 DIAGNOSIS — J1282 Pneumonia due to coronavirus disease 2019: Secondary | ICD-10-CM | POA: Diagnosis not present

## 2021-05-15 DIAGNOSIS — U071 COVID-19: Secondary | ICD-10-CM | POA: Diagnosis not present

## 2021-05-15 LAB — COMPREHENSIVE METABOLIC PANEL
ALT: 30 U/L (ref 0–44)
AST: 50 U/L — ABNORMAL HIGH (ref 15–41)
Albumin: 2.8 g/dL — ABNORMAL LOW (ref 3.5–5.0)
Alkaline Phosphatase: 43 U/L (ref 38–126)
Anion gap: 13 (ref 5–15)
BUN: 39 mg/dL — ABNORMAL HIGH (ref 8–23)
CO2: 22 mmol/L (ref 22–32)
Calcium: 8.3 mg/dL — ABNORMAL LOW (ref 8.9–10.3)
Chloride: 101 mmol/L (ref 98–111)
Creatinine, Ser: 0.78 mg/dL (ref 0.61–1.24)
GFR, Estimated: >60 mL/min (ref >60)
Glucose, Bld: 159 mg/dL — ABNORMAL HIGH (ref 70–99)
Potassium: 4 mmol/L (ref 3.5–5.1)
Sodium: 136 mmol/L (ref 135–145)
Total Bilirubin: 0.6 mg/dL (ref 0.3–1.2)
Total Protein: 7.1 g/dL (ref 6.5–8.1)

## 2021-05-15 LAB — CBC WITH DIFFERENTIAL/PLATELET
Abs Immature Granulocytes: 0.08 10*3/uL — ABNORMAL HIGH (ref 0.00–0.07)
Basophils Absolute: 0 10*3/uL (ref 0.0–0.1)
Basophils Relative: 0 %
Eosinophils Absolute: 0 10*3/uL (ref 0.0–0.5)
Eosinophils Relative: 0 %
HCT: 42.8 % (ref 39.0–52.0)
Hemoglobin: 14.6 g/dL (ref 13.0–17.0)
Immature Granulocytes: 1 %
Lymphocytes Relative: 8 %
Lymphs Abs: 0.7 10*3/uL (ref 0.7–4.0)
MCH: 32.4 pg (ref 26.0–34.0)
MCHC: 34.1 g/dL (ref 30.0–36.0)
MCV: 94.9 fL (ref 80.0–100.0)
Monocytes Absolute: 0.2 10*3/uL (ref 0.1–1.0)
Monocytes Relative: 2 %
Neutro Abs: 8 10*3/uL — ABNORMAL HIGH (ref 1.7–7.7)
Neutrophils Relative %: 89 %
Platelets: 258 10*3/uL (ref 150–400)
RBC: 4.51 MIL/uL (ref 4.22–5.81)
RDW: 13.9 % (ref 11.5–15.5)
WBC: 9 10*3/uL (ref 4.0–10.5)
nRBC: 0 % (ref 0.0–0.2)

## 2021-05-15 LAB — MAGNESIUM: Magnesium: 2.6 mg/dL — ABNORMAL HIGH (ref 1.7–2.4)

## 2021-05-15 LAB — PHOSPHORUS: Phosphorus: 4.2 mg/dL (ref 2.5–4.6)

## 2021-05-15 LAB — C-REACTIVE PROTEIN: CRP: 14.4 mg/dL — ABNORMAL HIGH (ref <1.0)

## 2021-05-15 LAB — D-DIMER, QUANTITATIVE: D-Dimer, Quant: 8.65 ug/mL-FEU — ABNORMAL HIGH (ref 0.00–0.50)

## 2021-05-15 MED ORDER — ENOXAPARIN SODIUM 100 MG/ML IJ SOSY
100.0000 mg | PREFILLED_SYRINGE | Freq: Two times a day (BID) | INTRAMUSCULAR | Status: DC
  Administered 2021-05-15 – 2021-05-18 (×8): 100 mg via SUBCUTANEOUS
  Filled 2021-05-15 (×9): qty 1

## 2021-05-15 MED ORDER — ENOXAPARIN SODIUM 80 MG/0.8ML IJ SOSY: 1.0000 mg/kg | PREFILLED_SYRINGE | Freq: Two times a day (BID) | INTRAMUSCULAR | Status: DC

## 2021-05-15 NOTE — Progress Notes (Signed)
    OVERNIGHT PROGRESS REPORT  Notified by RN for patient wish to DNI (Do Not Intubate)  Patient states that he is familiar with the risks and benefits of this and can restate this readily.  He states the wish to be DNI to the RN and to me in person.  Risks and benefits were restated to him and he affirms that he does NOT want to be intubated/on ventilator should he arrive at that need.  He will accept other measures as noted in the order.   Chinita Greenland MSNA MSN ACNPC-AG Acute Care Nurse Practitioner [facility]

## 2021-05-15 NOTE — Progress Notes (Signed)
, Not PROGRESS NOTE    Thomas Hammond  SLH:[id number] DOB: [birth year] DOA: 05/13/2021 PCP: Pcp, No    Brief Narrative:  70 year old gentleman with history of mild intermittent asthma which was recently diagnosed vaccinated against COVID-19 presents to the emergency room with ongoing dyspnea for about 5 weeks, worse for the last few days.  Difficult to sleep.  Cough and fever.  He visited emergency room few days ago, tries albuterol inhaler with no help so came back to the emergency room. In the emergency room he was significantly hypoxic needing 10 L of oxygen.  He was started on steroids albuterol and remdesivir and admitted to the hospital.   Assessment & Plan:   Active Problems:   Pneumonia due to COVID-19 virus  Pneumonia due to COVID-19 virus infection with acute hypoxemic respiratory failure: Continue to monitor due to significant symptoms  chest physiotherapy, incentive spirometry, deep breathing exercises, sputum induction, mucolytic's and bronchodilators. Supplemental oxygen to keep saturations more than 90%. Covid directed therapy with , steroids, on high-dose Solu-Medrol continue remdesivir, day 2/5 Started on baricitinib day 2 today. antibiotics, not indicated Due to severity of symptoms, patient will need daily inflammatory markers, chest x-rays, liver function test to monitor and direct COVID-19 therapies. With elevated D-dimer, CTA of the lung was negative for PE.  Will start patient on presumptive therapeutic Lovenox today.  Mild intermittent asthma: With exacerbation due to above.  Symptomatic treatment.   DVT prophylaxis:   Lovenox subcu   Code Status: Full code Family Communication: None.  Patient is communicating Disposition Plan: Status is: Inpatient  Remains inpatient appropriate because:Inpatient level of care appropriate due to severity of illness  Dispo: The patient is from: Home              Anticipated d/c is to: Home              Patient currently  is not medically stable to d/c.   Difficult to place patient No         Consultants:  None  Procedures:  None  Antimicrobials:  Remdesivir 7/25---   Subjective: Patient seen and examined.  Overnight remained on high flow oxygen, however he himself feels slightly better.  He slept better last night with less cough.  Able to talk in full sentences.  Looks anxious.  Objective: Vitals:   05/15/21 0800 05/15/21 0836 05/15/21 0900 05/15/21 1000  BP:  133/69 (!) 138/58 116/66  Pulse: 62 64 67 61  Resp: (!) 23 (!) 25 (!) 25 16  Temp:      TempSrc:      SpO2: 98% 93% 91% 91%  Weight:      Height:        Intake/Output Summary (Last 24 hours) at 05/15/2021 1039 Last data filed at 05/15/2021 0800 Gross per 24 hour  Intake 840 ml  Output 400 ml  Net 440 ml   Filed Weights   05/13/21 2132  Weight: 110.2 kg    Examination:  General exam: Appears in mild distress with tachypnea but able to talk in complete sentences.  Pleasant to conversation. SpO2: 91 % O2 Flow Rate (L/min): 15 L/min (plus NRBM) FiO2 (%): 100 %  Respiratory system: No added sounds. Cardiovascular system: S1 & S2 heard, RRR.  No edema.   Gastrointestinal system: Abdomen is nondistended, soft and nontender. No organomegaly or masses felt. Normal bowel sounds heard. Central nervous system: Alert and oriented. No focal neurological deficits. Extremities: Symmetric 5 x 5 power. Skin: No  rashes, lesions or ulcers Psychiatry: Judgement and insight appear normal. Mood & affect appropriate.     Data Reviewed: I have personally reviewed following labs and imaging studies  CBC: Recent Labs  Lab 05/09/21 1433 05/13/21 2152 05/14/21 1605 05/15/21 0242  WBC 3.6* 7.1 9.6 9.0  NEUTROABS 2.3 6.2  --  8.0*  HGB 16.5 16.3 15.7 14.6  HCT 46.9 46.2 45.5 42.8  MCV 93.4 92.8 94.6 94.9  PLT 145* 260 252 258   Basic Metabolic Panel: Recent Labs  Lab 05/09/21 1433 05/13/21 2152 05/14/21 1605 05/15/21 0242   NA 134* 134*  --  136  K 3.4* 3.7  --  4.0  CL 102 99  --  101  CO2 21* 24  --  22  GLUCOSE 113* 152*  --  159*  BUN 21 24*  --  39*  CREATININE 0.96 1.08 0.90 0.78  CALCIUM 8.1* 8.5*  --  8.3*  MG  --   --   --  2.6*  PHOS  --   --   --  4.2   GFR: Estimated Creatinine Clearance: 101.6 mL/min (by C-G formula based on SCr of 0.78 mg/dL). Liver Function Tests: Recent Labs  Lab 05/09/21 1433 05/15/21 0242  AST 95* 50*  ALT 52* 30  ALKPHOS 57 43  BILITOT 0.4 0.6  PROT 8.0 7.1  ALBUMIN 3.5 2.8*   No results for input(s): LIPASE, AMYLASE in the last 168 hours. No results for input(s): AMMONIA in the last 168 hours. Coagulation Profile: No results for input(s): INR, PROTIME in the last 168 hours. Cardiac Enzymes: No results for input(s): CKTOTAL, CKMB, CKMBINDEX, TROPONINI in the last 168 hours. BNP (last 3 results) No results for input(s): PROBNP in the last 8760 hours. HbA1C: No results for input(s): HGBA1C in the last 72 hours. CBG: No results for input(s): GLUCAP in the last 168 hours. Lipid Profile: No results for input(s): CHOL, HDL, LDLCALC, TRIG, CHOLHDL, LDLDIRECT in the last 72 hours. Thyroid Function Tests: No results for input(s): TSH, T4TOTAL, FREET4, T3FREE, THYROIDAB in the last 72 hours. Anemia Panel: No results for input(s): VITAMINB12, FOLATE, FERRITIN, TIBC, IRON, RETICCTPCT in the last 72 hours. Sepsis Labs: Recent Labs  Lab 05/14/21 1605  PROCALCITON 0.63    Recent Results (from the past 240 hour(s))  Resp Panel by RT-PCR (Flu A&B, Covid) Nasopharyngeal Swab     Status: Abnormal   Collection Time: 05/09/21  2:33 PM   Specimen: Nasopharyngeal Swab; Nasopharyngeal(NP) swabs in vial transport medium  Result Value Ref Range Status   SARS Coronavirus 2 by RT PCR POSITIVE (A) NEGATIVE Final    Comment: RESULT CALLED TO, READ BACK BY AND VERIFIED WITH: JERRI PEGRAM RN ON 05/09/21 AT 1616 SRC (NOTE) SARS-CoV-2 target nucleic acids are  DETECTED.  The SARS-CoV-2 RNA is generally detectable in upper respiratory specimens during the acute phase of infection. Positive results are indicative of the presence of the identified virus, but do not rule out bacterial infection or co-infection with other pathogens not detected by the test. Clinical correlation with patient history and other diagnostic information is necessary to determine patient infection status. The expected result is Negative.  Fact Sheet for Patients: BloggerCourse.com  Fact Sheet for Healthcare Providers: SeriousBroker.it  This test is not yet approved or cleared by the [place] FDA and  has been authorized for detection and/or diagnosis of SARS-CoV-2 by FDA under an Emergency Use Authorization (EUA).  This EUA will remain in effect (meaning this test  c an be used) for the duration of  the COVID-19 declaration under Section 564(b)(1) of the Act, 21 U.S.C. section 360bbb-3(b)(1), unless the authorization is terminated or revoked sooner.     Influenza A by PCR NEGATIVE NEGATIVE Final   Influenza B by PCR NEGATIVE NEGATIVE Final    Comment: (NOTE) The Xpert Xpress SARS-CoV-2/FLU/RSV plus assay is intended as an aid in the diagnosis of influenza from Nasopharyngeal swab specimens and should not be used as a sole basis for treatment. Nasal washings and aspirates are unacceptable for Xpert Xpress SARS-CoV-2/FLU/RSV testing.  Fact Sheet for Patients: BloggerCourse.com  Fact Sheet for Healthcare Providers: SeriousBroker.it  This test is not yet approved or cleared by the [place] FDA and has been authorized for detection and/or diagnosis of SARS-CoV-2 by FDA under an Emergency Use Authorization (EUA). This EUA will remain in effect (meaning this test can be used) for the duration of the COVID-19 declaration under Section 564(b)(1) of the Act, 21  U.S.C. section 360bbb-3(b)(1), unless the authorization is terminated or revoked.  Performed at [facility], [address] [place], [place] [address]   MRSA Next Gen by PCR, Nasal     Status: None   Collection Time: 05/14/21  4:06 PM   Specimen: Nasal Mucosa; Nasal Swab  Result Value Ref Range Status   MRSA by PCR Next Gen NOT DETECTED NOT DETECTED Final    Comment: (NOTE) The GeneXpert MRSA Assay (FDA approved for NASAL specimens only), is one component of a comprehensive MRSA colonization surveillance program. It is not intended to diagnose MRSA infection nor to guide or monitor treatment for MRSA infections. Test performance is not FDA approved in patients less than 69 years old. Performed at [facility], [address], [place], [place] [address]          Radiology Studies: CT Angio Chest PE W/Cm &/Or Wo Cm  Result Date: 05/13/2021 CLINICAL DATA:  Pulmonary embolus suspected with low to intermediate probability. Positive D-dimer. EXAM: CT ANGIOGRAPHY CHEST WITH CONTRAST TECHNIQUE: Multidetector CT imaging of the chest was performed using the standard protocol during bolus administration of intravenous contrast. Multiplanar CT image reconstructions and MIPs were obtained to evaluate the vascular anatomy. CONTRAST:  OMNIPAQUE IOHEXOL 350 MG/ML SOLN COMPARISON:  Chest radiograph 05/13/2021 FINDINGS: Cardiovascular: Good opacification of the central and segmental pulmonary arteries. No focal filling defects. No evidence of significant pulmonary embolus. Motion artifact limits evaluation of the lower lungs. Normal heart size. No pericardial effusions. Normal caliber thoracic aorta. No aortic dissection. Great vessel origins are patent. Mediastinum/Nodes: Esophagus is decompressed. Small esophageal hiatal hernia. Scattered calcified lymph nodes. No significant lymphadenopathy. Thyroid gland is unremarkable. Lungs/Pleura: Diffuse distribution of  somewhat patchy airspace disease throughout both lungs with a mostly peripheral distribution pattern. Changes likely to represent multifocal pneumonia. No pleural effusions. No pneumothorax. Upper Abdomen: Surgical absence of the gallbladder. No acute abnormalities demonstrated in the visualized upper abdomen. Musculoskeletal: Degenerative changes in the spine. No destructive bone lesions. Review of the MIP images confirms the above findings. IMPRESSION: 1. No evidence of significant pulmonary embolus. 2. Diffuse airspace disease throughout the lungs with peripheral distribution. Most likely represents multifocal pneumonia. Electronically Signed   By: Burman Nieves M.D.   On: 05/13/2021 23:45   DG Chest Port 1 View  Result Date: 05/13/2021 CLINICAL DATA:  Short of breath for 6 weeks. EXAM: PORTABLE CHEST 1 VIEW COMPARISON:  05/09/2021. FINDINGS: Cardiac silhouette is borderline enlarged. No mediastinal or  hilar masses. Hazy opacities noted in the mid to lower lung zones with associated interstitial prominence. Findings are similar to the prior study. No new lung abnormalities. No convincing pleural effusion and no pneumothorax. Previous left shoulder rotator cuff surgery. Skeletal structures grossly intact. IMPRESSION: 1. No interval change from the study performed 4 days ago. 2. Subtle hazy opacities in the mid to lower lungs, most evident at the left lung base, which may reflect multifocal pneumonia with atypical etiologies including viral infection in the differential. Patient reportedly did test positive for COVID-19. Electronically Signed   By: Amie Portlandavid  Ormond M.D.   On: 05/13/2021 22:34        Scheduled Meds:  vitamin C  500 mg Oral Daily   baricitinib  4 mg Oral Daily   Chlorhexidine Gluconate Cloth  6 each Topical Daily   enoxaparin (LOVENOX) injection  1 mg/kg (Adjusted) Subcutaneous Q12H   Ipratropium-Albuterol  1 puff Inhalation Q6H   mouth rinse  15 mL Mouth Rinse BID    methylPREDNISolone (SOLU-MEDROL) injection  1 mg/kg Intravenous Q12H   Followed by   Melene Muller[START ON 05/18/2021] predniSONE  50 mg Oral Daily   zinc sulfate  220 mg Oral Daily   Continuous Infusions:  remdesivir 100 mg in NS 100 mL 100 mg (05/15/21 1024)     LOS: 1 day    Time spent: 35 minutes    Dorcas CarrowKuber Sabreena Vogan, MD [facility] Pager [phone]

## 2021-05-15 NOTE — TOC Initial Note (Signed)
Transition of Care [facility]) - Initial/Assessment Note    Patient Details  Name: Thomas Hammond MRN: [id number] Date of Birth: 12/21/1950  Transition of [facility]) CM/SW Contact:    Golda Acre, RN Phone Number: 05/15/2021, 8:46 AM  Clinical Narrative:                 70 y.o. male with medical history significant of asthma. Presenting with dyspnea. His symptoms began about 5 weeks ago. He had a dry cough and difficulty catching his breath. His symptoms persisted and seemed to worsen at the end of last week when he was having difficulty maintaining sleep. His cough was keeping him up at night. He visited to the ED and was told that he was positive for COVID. He was told he was outside the window for paxlovid use and was given a prescription for an albuterol inhaler w/ spacer. He went him and tried to use the inhaler, but his symptoms worsened over the last couple of days. He decided he needed to return to the ED for evaluation.   ED Course: He was found to be significantly hypoxic. He was started on remdes, steroids, albuterol. A CTA PE was obtained. It was negative for PE. TRH was called for admission.   TOC PLAN OF CARE: Following for progression and toc needs.  Expected Discharge Plan: Home/Self Care Barriers to Discharge: Continued Medical Work up   Patient Goals and CMS Choice Patient states their goals for this hospitalization and ongoing recovery are:: to go home CMS Medicare.gov Compare Post Acute Care list provided to:: Patient    Expected Discharge Plan and Services Expected Discharge Plan: Home/Self Care   Discharge Planning Services: CM Consult   Living arrangements for the past 2 months: Single Family Home                                      Prior Living Arrangements/Services Living arrangements for the past 2 months: Single Family Home Lives with:: Self Patient language and need for interpreter reviewed:: Yes Do you feel safe going back to the place  where you live?: Yes            Criminal Activity/Legal Involvement Pertinent to Current Situation/Hospitalization: No - Comment as needed  Activities of Daily Living Home Assistive Devices/Equipment: None ADL Screening (condition at time of admission) Patient's cognitive ability adequate to safely complete daily activities?: Yes Is the patient deaf or have difficulty hearing?: No Does the patient have difficulty seeing, even when wearing glasses/contacts?: No Does the patient have difficulty concentrating, remembering, or making decisions?: No Patient able to express need for assistance with ADLs?: Yes Does the patient have difficulty dressing or bathing?: No Independently performs ADLs?: Yes (appropriate for developmental age) Does the patient have difficulty walking or climbing stairs?: No Weakness of Legs: None Weakness of Arms/Hands: None  Permission Sought/Granted                  Emotional Assessment Appearance:: Appears stated age Attitude/Demeanor/Rapport: Engaged Affect (typically observed): Calm Orientation: : Oriented to Place, Oriented to Self, Oriented to  Time, Oriented to Situation Alcohol / Substance Use: Not Applicable Psych Involvement: No (comment)  Admission diagnosis:  Hypoxia [R09.02] Pneumonia due to COVID-19 virus [U07.1, J12.82] Patient Active Problem List   Diagnosis Date Noted   Pneumonia due to COVID-19 virus 05/13/2021   PCP:  Pcp, No Pharmacy:  [id number] [place], [place] - [address] [place] [address] Phone: [phone] Fax: [phone]     Social Determinants of Health (SDOH) Interventions    Readmission Risk Interventions No flowsheet data found.

## 2021-05-15 NOTE — Progress Notes (Signed)
Jaiven was sleeping when chaplain came by.  Chaplain will not be able to complete Advanced Directive traditionally with paperwork, witnesses, and notarization in place.  Chaplain will follow-up with more information.     05/15/21 1700  Clinical Encounter Type  Visited With Patient;Patient not available  Visit Type Initial

## 2021-05-16 DIAGNOSIS — U071 COVID-19: Secondary | ICD-10-CM | POA: Diagnosis not present

## 2021-05-16 DIAGNOSIS — J1282 Pneumonia due to coronavirus disease 2019: Secondary | ICD-10-CM | POA: Diagnosis not present

## 2021-05-16 LAB — CBC WITH DIFFERENTIAL/PLATELET
Abs Immature Granulocytes: 0.05 10*3/uL (ref 0.00–0.07)
Basophils Absolute: 0 10*3/uL (ref 0.0–0.1)
Basophils Relative: 0 %
Eosinophils Absolute: 0 10*3/uL (ref 0.0–0.5)
Eosinophils Relative: 0 %
HCT: 44.6 % (ref 39.0–52.0)
Hemoglobin: 15.3 g/dL (ref 13.0–17.0)
Immature Granulocytes: 1 %
Lymphocytes Relative: 6 %
Lymphs Abs: 0.6 10*3/uL — ABNORMAL LOW (ref 0.7–4.0)
MCH: 32.3 pg (ref 26.0–34.0)
MCHC: 34.3 g/dL (ref 30.0–36.0)
MCV: 94.3 fL (ref 80.0–100.0)
Monocytes Absolute: 0.3 10*3/uL (ref 0.1–1.0)
Monocytes Relative: 4 %
Neutro Abs: 8.4 10*3/uL — ABNORMAL HIGH (ref 1.7–7.7)
Neutrophils Relative %: 89 %
Platelets: 303 10*3/uL (ref 150–400)
RBC: 4.73 MIL/uL (ref 4.22–5.81)
RDW: 13.5 % (ref 11.5–15.5)
WBC: 9.4 10*3/uL (ref 4.0–10.5)
nRBC: 0 % (ref 0.0–0.2)

## 2021-05-16 LAB — COMPREHENSIVE METABOLIC PANEL
ALT: 34 U/L (ref 0–44)
AST: 48 U/L — ABNORMAL HIGH (ref 15–41)
Albumin: 2.9 g/dL — ABNORMAL LOW (ref 3.5–5.0)
Alkaline Phosphatase: 59 U/L (ref 38–126)
Anion gap: 13 (ref 5–15)
BUN: 44 mg/dL — ABNORMAL HIGH (ref 8–23)
CO2: 26 mmol/L (ref 22–32)
Calcium: 8.7 mg/dL — ABNORMAL LOW (ref 8.9–10.3)
Chloride: 101 mmol/L (ref 98–111)
Creatinine, Ser: 0.97 mg/dL (ref 0.61–1.24)
GFR, Estimated: >60 mL/min (ref >60)
Glucose, Bld: 177 mg/dL — ABNORMAL HIGH (ref 70–99)
Potassium: 3.9 mmol/L (ref 3.5–5.1)
Sodium: 140 mmol/L (ref 135–145)
Total Bilirubin: 0.7 mg/dL (ref 0.3–1.2)
Total Protein: 7.5 g/dL (ref 6.5–8.1)

## 2021-05-16 LAB — C-REACTIVE PROTEIN: CRP: 5.6 mg/dL — ABNORMAL HIGH (ref <1.0)

## 2021-05-16 LAB — D-DIMER, QUANTITATIVE: D-Dimer, Quant: 4.4 ug/mL-FEU — ABNORMAL HIGH (ref 0.00–0.50)

## 2021-05-16 MED ORDER — PHENOL 1.4 % MT LIQD
1.0000 | OROMUCOSAL | Status: DC | PRN
  Administered 2021-05-16: 1 via OROMUCOSAL
  Filled 2021-05-16: qty 177

## 2021-05-16 NOTE — Progress Notes (Signed)
, Not PROGRESS NOTE    Thomas Hammond  DJT:[id number] DOB: 1951/05/07 DOA: 05/13/2021 PCP: Pcp, No    Brief Narrative:  70 year old gentleman with history of mild intermittent asthma which was recently diagnosed vaccinated against COVID-19 presents to the emergency room with ongoing dyspnea for about 5 weeks, worse for the last few days.  Difficult to sleep.  Cough and fever.  He visited emergency room few days ago, tries albuterol inhaler with no help so came back to the emergency room. In the emergency room he was significantly hypoxic needing 10 L of oxygen.  He was started on steroids albuterol and remdesivir and admitted to the hospital.   Assessment & Plan:   Active Problems:   Pneumonia due to COVID-19 virus  Pneumonia due to COVID-19 virus infection with acute hypoxemic respiratory failure: Patient is still with significant symptoms and hypoxemia.  Currently remains on 15 L high flow oxygen. chest physiotherapy, incentive spirometry, deep breathing exercises, sputum induction, mucolytic's and bronchodilators. Supplemental oxygen to keep saturations more than 90%. Covid directed therapy with , steroids, on high-dose Solu-Medrol continue remdesivir, day 3/5 Started on baricitinib day 3 today.  We will continue for 14 days or until discharge. antibiotics, not indicated Due to severity of symptoms, patient will need daily inflammatory markers, chest x-rays, liver function test to monitor and direct COVID-19 therapies. With elevated D-dimer, CTA of the lung was negative for PE.  Remains on high-dose Lovenox. Inflammation markers and D-dimer trending down.  Mild intermittent asthma: With exacerbation due to above.  Symptomatic treatment.   DVT prophylaxis:   Lovenox subcu   Code Status: DNR. Family Communication: None.  States he will call himself. Disposition Plan: Status is: Inpatient  Remains inpatient appropriate because:Inpatient level of care appropriate due to severity  of illness  Dispo: The patient is from: Home              Anticipated d/c is to: Home              Patient currently is not medically stable to d/c.   Difficult to place patient No         Consultants:  None  Procedures:  None  Antimicrobials:  Remdesivir 7/25---   Subjective: Patient seen and examined.  Overnight had much cough but breathing is better.  Today he is able to eat without any trouble.  Wearing high flow nasal cannula oxygen.  Denies any chest pain.  Objective: Vitals:   05/16/21 1005 05/16/21 1100 05/16/21 1107 05/16/21 1200  BP: 119/69 134/71  (!) 138/55  Pulse: 69 73 70 (!) 59  Resp: (!) 25 (!) 27 17 (!) 23  Temp:      TempSrc:      SpO2: 93% (!) 87% (!) 88% 96%  Weight:      Height:        Intake/Output Summary (Last 24 hours) at 05/16/2021 1249 Last data filed at 05/16/2021 1000 Gross per 24 hour  Intake 820 ml  Output 375 ml  Net 445 ml   Filed Weights   05/13/21 2132  Weight: 110.2 kg    Examination:  General exam: In mild distress.  Mostly able to keep up conversations. SpO2: 96 % O2 Flow Rate (L/min): 15 L/min FiO2 (%): 100 %  Respiratory system: Conducted airway sounds. Cardiovascular system: S1 & S2 heard, RRR.  No edema.   Gastrointestinal system: Abdomen is nondistended, soft and nontender. No organomegaly or masses felt. Normal bowel sounds heard. Central nervous system:  Alert and oriented. No focal neurological deficits. Extremities: Symmetric 5 x 5 power. Skin: No rashes, lesions or ulcers Psychiatry: Judgement and insight appear normal. Mood & affect appropriate.     Data Reviewed: I have personally reviewed following labs and imaging studies  CBC: Recent Labs  Lab 05/09/21 1433 05/13/21 2152 05/14/21 1605 05/15/21 0242 05/16/21 0240  WBC 3.6* 7.1 9.6 9.0 9.4  NEUTROABS 2.3 6.2  --  8.0* 8.4*  HGB 16.5 16.3 15.7 14.6 15.3  HCT 46.9 46.2 45.5 42.8 44.6  MCV 93.4 92.8 94.6 94.9 94.3  PLT 145* 260 252 258 303    Basic Metabolic Panel: Recent Labs  Lab 05/09/21 1433 05/13/21 2152 05/14/21 1605 05/15/21 0242 05/16/21 0240  NA 134* 134*  --  136 140  K 3.4* 3.7  --  4.0 3.9  CL 102 99  --  101 101  CO2 21* 24  --  22 26  GLUCOSE 113* 152*  --  159* 177*  BUN 21 24*  --  39* 44*  CREATININE 0.96 1.08 0.90 0.78 0.97  CALCIUM 8.1* 8.5*  --  8.3* 8.7*  MG  --   --   --  2.6*  --   PHOS  --   --   --  4.2  --    GFR: Estimated Creatinine Clearance: 83.8 mL/min (by C-G formula based on SCr of 0.97 mg/dL). Liver Function Tests: Recent Labs  Lab 05/09/21 1433 05/15/21 0242 05/16/21 0240  AST 95* 50* 48*  ALT 52* 30 34  ALKPHOS 57 43 59  BILITOT 0.4 0.6 0.7  PROT 8.0 7.1 7.5  ALBUMIN 3.5 2.8* 2.9*   No results for input(s): LIPASE, AMYLASE in the last 168 hours. No results for input(s): AMMONIA in the last 168 hours. Coagulation Profile: No results for input(s): INR, PROTIME in the last 168 hours. Cardiac Enzymes: No results for input(s): CKTOTAL, CKMB, CKMBINDEX, TROPONINI in the last 168 hours. BNP (last 3 results) No results for input(s): PROBNP in the last 8760 hours. HbA1C: No results for input(s): HGBA1C in the last 72 hours. CBG: No results for input(s): GLUCAP in the last 168 hours. Lipid Profile: No results for input(s): CHOL, HDL, LDLCALC, TRIG, CHOLHDL, LDLDIRECT in the last 72 hours. Thyroid Function Tests: No results for input(s): TSH, T4TOTAL, FREET4, T3FREE, THYROIDAB in the last 72 hours. Anemia Panel: No results for input(s): VITAMINB12, FOLATE, FERRITIN, TIBC, IRON, RETICCTPCT in the last 72 hours. Sepsis Labs: Recent Labs  Lab 05/14/21 1605  PROCALCITON 0.63    Recent Results (from the past 240 hour(s))  Resp Panel by RT-PCR (Flu A&B, Covid) Nasopharyngeal Swab     Status: Abnormal   Collection Time: 05/09/21  2:33 PM   Specimen: Nasopharyngeal Swab; Nasopharyngeal(NP) swabs in vial transport medium  Result Value Ref Range Status   SARS Coronavirus 2  by RT PCR POSITIVE (A) NEGATIVE Final    Comment: RESULT CALLED TO, READ BACK BY AND VERIFIED WITH: JERRI PEGRAM RN ON 05/09/21 AT 1616 SRC (NOTE) SARS-CoV-2 target nucleic acids are DETECTED.  The SARS-CoV-2 RNA is generally detectable in upper respiratory specimens during the acute phase of infection. Positive results are indicative of the presence of the identified virus, but do not rule out bacterial infection or co-infection with other pathogens not detected by the test. Clinical correlation with patient history and other diagnostic information is necessary to determine patient infection status. The expected result is Negative.  Fact Sheet for Patients: BloggerCourse.com  Fact Sheet  for Healthcare Providers: SeriousBroker.it  This test is not yet approved or cleared by the [place] and  has been authorized for detection and/or diagnosis of SARS-CoV-2 by FDA under an Emergency Use Authorization (EUA).  This EUA will remain in effect (meaning this test c an be used) for the duration of  the COVID-19 declaration under Section 564(b)(1) of the Act, 21 U.S.C. section 360bbb-3(b)(1), unless the authorization is terminated or revoked sooner.     Influenza A by PCR NEGATIVE NEGATIVE Final   Influenza B by PCR NEGATIVE NEGATIVE Final    Comment: (NOTE) The Xpert Xpress SARS-CoV-2/FLU/RSV plus assay is intended as an aid in the diagnosis of influenza from Nasopharyngeal swab specimens and should not be used as a sole basis for treatment. Nasal washings and aspirates are unacceptable for Xpert Xpress SARS-CoV-2/FLU/RSV testing.  Fact Sheet for Patients: BloggerCourse.com  Fact Sheet for Healthcare Providers: SeriousBroker.it  This test is not yet approved or cleared by the [place] FDA and has been authorized for detection and/or diagnosis of SARS-CoV-2 by FDA under  an Emergency Use Authorization (EUA). This EUA will remain in effect (meaning this test can be used) for the duration of the COVID-19 declaration under Section 564(b)(1) of the Act, 21 U.S.C. section 360bbb-3(b)(1), unless the authorization is terminated or revoked.  Performed at [facility], [address] [place], [place] [address]   MRSA Next Gen by PCR, Nasal     Status: None   Collection Time: 05/14/21  4:06 PM   Specimen: Nasal Mucosa; Nasal Swab  Result Value Ref Range Status   MRSA by PCR Next Gen NOT DETECTED NOT DETECTED Final    Comment: (NOTE) The GeneXpert MRSA Assay (FDA approved for NASAL specimens only), is one component of a comprehensive MRSA colonization surveillance program. It is not intended to diagnose MRSA infection nor to guide or monitor treatment for MRSA infections. Test performance is not FDA approved in patients less than 39 years old. Performed at [facility], [address], [place], [place] [address]          Radiology Studies: No results found.      Scheduled Meds:  vitamin C  500 mg Oral Daily   baricitinib  4 mg Oral Daily   Chlorhexidine Gluconate Cloth  6 each Topical Daily   enoxaparin (LOVENOX) injection  100 mg Subcutaneous BID   Ipratropium-Albuterol  1 puff Inhalation Q6H   mouth rinse  15 mL Mouth Rinse BID   methylPREDNISolone (SOLU-MEDROL) injection  1 mg/kg Intravenous Q12H   Followed by   Melene Muller ON 05/18/2021] predniSONE  50 mg Oral Daily   zinc sulfate  220 mg Oral Daily   Continuous Infusions:  remdesivir 100 mg in NS 100 mL Stopped (05/16/21 0952)     LOS: 2 days    Time spent: 32 minutes    Dorcas Carrow, MD [facility] Pager [phone]

## 2021-05-17 ENCOUNTER — Inpatient Hospital Stay (HOSPITAL_COMMUNITY): Payer: Medicare Other

## 2021-05-17 DIAGNOSIS — U071 COVID-19: Secondary | ICD-10-CM | POA: Diagnosis not present

## 2021-05-17 DIAGNOSIS — J1282 Pneumonia due to coronavirus disease 2019: Secondary | ICD-10-CM | POA: Diagnosis not present

## 2021-05-17 LAB — COMPREHENSIVE METABOLIC PANEL
ALT: 50 U/L — ABNORMAL HIGH (ref 0–44)
AST: 70 U/L — ABNORMAL HIGH (ref 15–41)
Albumin: 2.7 g/dL — ABNORMAL LOW (ref 3.5–5.0)
Alkaline Phosphatase: 57 U/L (ref 38–126)
Anion gap: 10 (ref 5–15)
BUN: 37 mg/dL — ABNORMAL HIGH (ref 8–23)
CO2: 26 mmol/L (ref 22–32)
Calcium: 8.3 mg/dL — ABNORMAL LOW (ref 8.9–10.3)
Chloride: 103 mmol/L (ref 98–111)
Creatinine, Ser: 0.88 mg/dL (ref 0.61–1.24)
GFR, Estimated: >60 mL/min (ref >60)
Glucose, Bld: 163 mg/dL — ABNORMAL HIGH (ref 70–99)
Potassium: 4.4 mmol/L (ref 3.5–5.1)
Sodium: 139 mmol/L (ref 135–145)
Total Bilirubin: 0.9 mg/dL (ref 0.3–1.2)
Total Protein: 6.9 g/dL (ref 6.5–8.1)

## 2021-05-17 LAB — CBC WITH DIFFERENTIAL/PLATELET
Abs Immature Granulocytes: 0.05 10*3/uL (ref 0.00–0.07)
Basophils Absolute: 0 10*3/uL (ref 0.0–0.1)
Basophils Relative: 0 %
Eosinophils Absolute: 0 10*3/uL (ref 0.0–0.5)
Eosinophils Relative: 0 %
HCT: 41.3 % (ref 39.0–52.0)
Hemoglobin: 14.1 g/dL (ref 13.0–17.0)
Immature Granulocytes: 1 %
Lymphocytes Relative: 6 %
Lymphs Abs: 0.5 10*3/uL — ABNORMAL LOW (ref 0.7–4.0)
MCH: 32.1 pg (ref 26.0–34.0)
MCHC: 34.1 g/dL (ref 30.0–36.0)
MCV: 94.1 fL (ref 80.0–100.0)
Monocytes Absolute: 0.4 10*3/uL (ref 0.1–1.0)
Monocytes Relative: 5 %
Neutro Abs: 7.1 10*3/uL (ref 1.7–7.7)
Neutrophils Relative %: 88 %
Platelets: 257 10*3/uL (ref 150–400)
RBC: 4.39 MIL/uL (ref 4.22–5.81)
RDW: 13.4 % (ref 11.5–15.5)
WBC: 8 10*3/uL (ref 4.0–10.5)
nRBC: 0 % (ref 0.0–0.2)

## 2021-05-17 LAB — C-REACTIVE PROTEIN: CRP: 2 mg/dL — ABNORMAL HIGH (ref <1.0)

## 2021-05-17 LAB — D-DIMER, QUANTITATIVE: D-Dimer, Quant: 2.66 ug/mL-FEU — ABNORMAL HIGH (ref 0.00–0.50)

## 2021-05-17 MED ORDER — FUROSEMIDE 10 MG/ML IJ SOLN
40.0000 mg | Freq: Once | INTRAMUSCULAR | Status: AC
  Administered 2021-05-17: 40 mg via INTRAVENOUS
  Filled 2021-05-17: qty 4

## 2021-05-17 MED ORDER — SODIUM CHLORIDE 0.9 % IV SOLN
100.0000 mg | Freq: Every day | INTRAVENOUS | Status: AC
  Administered 2021-05-18: 100 mg via INTRAVENOUS
  Filled 2021-05-17: qty 20

## 2021-05-17 NOTE — Plan of Care (Signed)

## 2021-05-17 NOTE — Progress Notes (Signed)
While resting, patient's HR is dropping as low as mid 40's, SB. Previously, the lowest this RN has noted HR was in the 50's. Dr. Jerral Ralph notified by this RN, and per MD, he will order lasix and will continue monitoring.

## 2021-05-17 NOTE — Progress Notes (Signed)
, Not PROGRESS NOTE    Thomas Hammond  RCV:[id number] DOB: [birth year] DOA: 05/13/2021 PCP: Pcp, No    Brief Narrative:  70 year old gentleman with history of mild intermittent asthma which was recently diagnosed vaccinated against COVID-19 presents to the emergency room with ongoing dyspnea for about 5 weeks, worse for the last few days.  Difficult to sleep.  Cough and fever.  He visited emergency room few days ago, tries albuterol inhaler with no help so came back to the emergency room. In the emergency room he was significantly hypoxic needing 10 L of oxygen.  He was started on steroids albuterol and remdesivir and admitted to the hospital. Patient overall clinically in a stable condition, still on very high flow oxygen.   Assessment & Plan:   Active Problems:   Pneumonia due to COVID-19 virus  Pneumonia due to COVID-19 virus infection with acute hypoxemic respiratory failure: Patient is still with significant symptoms and hypoxemia.  Currently remains on 15 L high flow oxygen as well as using nonrebreather. He has desired to be no intubation. chest physiotherapy, incentive spirometry, deep breathing exercises, sputum induction, mucolytic's and bronchodilators. Supplemental oxygen to keep saturations more than 90%. Covid directed therapy with , steroids, on high-dose Solu-Medrol continue remdesivir, day 4/5 Started on baricitinib day 3 today.  We will continue for 14 days or until discharge. antibiotics, not indicated Due to severity of symptoms, patient will need daily inflammatory markers, chest x-rays, liver function test to monitor and direct COVID-19 therapies. With elevated D-dimer, CTA of the lung was negative for PE.  Remains on high-dose Lovenox. Inflammation markers and D-dimer trending down. Repeat chest x-ray today.  Mild intermittent asthma: With exacerbation due to above.  Symptomatic treatment.   DVT prophylaxis:   Lovenox subcu   Code Status: DNR.  No mechanical  intubation. Family Communication: None.  States he will call himself. Disposition Plan: Status is: Inpatient  Remains inpatient appropriate because:Inpatient level of care appropriate due to severity of illness  Dispo: The patient is from: Home              Anticipated d/c is to: Home              Patient currently is not medically stable to d/c.   Difficult to place patient No         Consultants:  None  Procedures:  None  Antimicrobials:  Remdesivir 7/25---   Subjective: Patient seen and examined.  Sitting in the chair and eating lunch.  80 to 86% on 15 L high flow.  Has some trouble taking deep breaths but does not look tired.  He has nonrebreather handy for low oxygen.  Afebrile.  Minimal dry cough.  Objective: Vitals:   05/17/21 0751 05/17/21 0800 05/17/21 0804 05/17/21 0900  BP:   (!) 149/68 (!) 119/52  Pulse:  (!) 59 (!) 57 (!) 54  Resp:  20 (!) 28 (!) 23  Temp: 98 F (36.7 C)     TempSrc: Axillary     SpO2:  91% (!) 89% 92%  Weight:      Height:        Intake/Output Summary (Last 24 hours) at 05/17/2021 1040 Last data filed at 05/16/2021 1727 Gross per 24 hour  Intake 240 ml  Output 200 ml  Net 40 ml   Filed Weights   05/13/21 2132  Weight: 110.2 kg    Examination:  General exam: In mild distress and anxious.  Able to talk in complete sentences.  SpO2: 92 % O2 Flow Rate (L/min): 15 L/min FiO2 (%): 100 %  Respiratory system: Conducted airway sounds. Cardiovascular system: S1 & S2 heard, RRR.  No edema.   Gastrointestinal system: Abdomen is nondistended, soft and nontender. No organomegaly or masses felt. Normal bowel sounds heard. Central nervous system: Alert and oriented. No focal neurological deficits. Extremities: Symmetric 5 x 5 power. Skin: No rashes, lesions or ulcers Psychiatry: Judgement and insight appear normal. Mood & affect appropriate.     Data Reviewed: I have personally reviewed following labs and imaging  studies  CBC: Recent Labs  Lab 05/13/21 2152 05/14/21 1605 05/15/21 0242 05/16/21 0240 05/17/21 0301  WBC 7.1 9.6 9.0 9.4 8.0  NEUTROABS 6.2  --  8.0* 8.4* 7.1  HGB 16.3 15.7 14.6 15.3 14.1  HCT 46.2 45.5 42.8 44.6 41.3  MCV 92.8 94.6 94.9 94.3 94.1  PLT 260 252 258 303 257   Basic Metabolic Panel: Recent Labs  Lab 05/13/21 2152 05/14/21 1605 05/15/21 0242 05/16/21 0240 05/17/21 0301  NA 134*  --  136 140 139  K 3.7  --  4.0 3.9 4.4  CL 99  --  101 101 103  CO2 24  --  22 26 26   GLUCOSE 152*  --  159* 177* 163*  BUN 24*  --  39* 44* 37*  CREATININE 1.08 0.90 0.78 0.97 0.88  CALCIUM 8.5*  --  8.3* 8.7* 8.3*  MG  --   --  2.6*  --   --   PHOS  --   --  4.2  --   --    GFR: Estimated Creatinine Clearance: 92.3 mL/min (by C-G formula based on SCr of 0.88 mg/dL). Liver Function Tests: Recent Labs  Lab 05/15/21 0242 05/16/21 0240 05/17/21 0301  AST 50* 48* 70*  ALT 30 34 50*  ALKPHOS 43 59 57  BILITOT 0.6 0.7 0.9  PROT 7.1 7.5 6.9  ALBUMIN 2.8* 2.9* 2.7*   No results for input(s): LIPASE, AMYLASE in the last 168 hours. No results for input(s): AMMONIA in the last 168 hours. Coagulation Profile: No results for input(s): INR, PROTIME in the last 168 hours. Cardiac Enzymes: No results for input(s): CKTOTAL, CKMB, CKMBINDEX, TROPONINI in the last 168 hours. BNP (last 3 results) No results for input(s): PROBNP in the last 8760 hours. HbA1C: No results for input(s): HGBA1C in the last 72 hours. CBG: No results for input(s): GLUCAP in the last 168 hours. Lipid Profile: No results for input(s): CHOL, HDL, LDLCALC, TRIG, CHOLHDL, LDLDIRECT in the last 72 hours. Thyroid Function Tests: No results for input(s): TSH, T4TOTAL, FREET4, T3FREE, THYROIDAB in the last 72 hours. Anemia Panel: No results for input(s): VITAMINB12, FOLATE, FERRITIN, TIBC, IRON, RETICCTPCT in the last 72 hours. Sepsis Labs: Recent Labs  Lab 05/14/21 1605  PROCALCITON 0.63    Recent  Results (from the past 240 hour(s))  Resp Panel by RT-PCR (Flu A&B, Covid) Nasopharyngeal Swab     Status: Abnormal   Collection Time: 05/09/21  2:33 PM   Specimen: Nasopharyngeal Swab; Nasopharyngeal(NP) swabs in vial transport medium  Result Value Ref Range Status   SARS Coronavirus 2 by RT PCR POSITIVE (A) NEGATIVE Final    Comment: RESULT CALLED TO, READ BACK BY AND VERIFIED WITH: JERRI PEGRAM RN ON 05/09/21 AT 1616 SRC (NOTE) SARS-CoV-2 target nucleic acids are DETECTED.  The SARS-CoV-2 RNA is generally detectable in upper respiratory specimens during the acute phase of infection. Positive results are indicative of the presence  of the identified virus, but do not rule out bacterial infection or co-infection with other pathogens not detected by the test. Clinical correlation with patient history and other diagnostic information is necessary to determine patient infection status. The expected result is Negative.  Fact Sheet for Patients: BloggerCourse.com  Fact Sheet for Healthcare Providers: SeriousBroker.it  This test is not yet approved or cleared by the [place] FDA and  has been authorized for detection and/or diagnosis of SARS-CoV-2 by FDA under an Emergency Use Authorization (EUA).  This EUA will remain in effect (meaning this test c an be used) for the duration of  the COVID-19 declaration under Section 564(b)(1) of the Act, 21 U.S.C. section 360bbb-3(b)(1), unless the authorization is terminated or revoked sooner.     Influenza A by PCR NEGATIVE NEGATIVE Final   Influenza B by PCR NEGATIVE NEGATIVE Final    Comment: (NOTE) The Xpert Xpress SARS-CoV-2/FLU/RSV plus assay is intended as an aid in the diagnosis of influenza from Nasopharyngeal swab specimens and should not be used as a sole basis for treatment. Nasal washings and aspirates are unacceptable for Xpert Xpress SARS-CoV-2/FLU/RSV testing.  Fact Sheet  for Patients: BloggerCourse.com  Fact Sheet for Healthcare Providers: SeriousBroker.it  This test is not yet approved or cleared by the [place] FDA and has been authorized for detection and/or diagnosis of SARS-CoV-2 by FDA under an Emergency Use Authorization (EUA). This EUA will remain in effect (meaning this test can be used) for the duration of the COVID-19 declaration under Section 564(b)(1) of the Act, 21 U.S.C. section 360bbb-3(b)(1), unless the authorization is terminated or revoked.  Performed at [facility], [address] [place], [place] [address]   MRSA Next Gen by PCR, Nasal     Status: None   Collection Time: 05/14/21  4:06 PM   Specimen: Nasal Mucosa; Nasal Swab  Result Value Ref Range Status   MRSA by PCR Next Gen NOT DETECTED NOT DETECTED Final    Comment: (NOTE) The GeneXpert MRSA Assay (FDA approved for NASAL specimens only), is one component of a comprehensive MRSA colonization surveillance program. It is not intended to diagnose MRSA infection nor to guide or monitor treatment for MRSA infections. Test performance is not FDA approved in patients less than 55 years old. Performed at [facility], [address] [place], [place] [address]          Radiology Studies: No results found.      Scheduled Meds:  vitamin C  500 mg Oral Daily   baricitinib  4 mg Oral Daily   Chlorhexidine Gluconate Cloth  6 each Topical Daily   enoxaparin (LOVENOX) injection  100 mg Subcutaneous BID   Ipratropium-Albuterol  1 puff Inhalation Q6H   mouth rinse  15 mL Mouth Rinse BID   [START ON 05/18/2021] predniSONE  50 mg Oral Daily   zinc sulfate  220 mg Oral Daily   Continuous Infusions:  remdesivir 100 mg in NS 100 mL 100 mg (05/17/21 1021)     LOS: 3 days    Time spent: 32 minutes    Dorcas Carrow, MD [facility] Pager [phone]

## 2021-05-17 NOTE — Progress Notes (Signed)
When RN hung AM dose of remdesivir, the prior dose was still hanging on IV pole. The secondary bag appeared to be completely full with clamp still engaged. This RN notified pharmacy that it appeared pt had missed prior dose. Per Weston Brass in pharmacy, he will add a day of remdesivir to make up for presumed missed dose.

## 2021-05-17 NOTE — Plan of Care (Signed)
RN will continue to carefully monitor patient's progression of care plan.  

## 2021-05-18 ENCOUNTER — Inpatient Hospital Stay (HOSPITAL_COMMUNITY): Payer: Medicare Other

## 2021-05-18 DIAGNOSIS — R7989 Other specified abnormal findings of blood chemistry: Secondary | ICD-10-CM

## 2021-05-18 DIAGNOSIS — R0902 Hypoxemia: Secondary | ICD-10-CM | POA: Diagnosis not present

## 2021-05-18 DIAGNOSIS — U071 COVID-19: Secondary | ICD-10-CM

## 2021-05-18 DIAGNOSIS — J1282 Pneumonia due to coronavirus disease 2019: Secondary | ICD-10-CM | POA: Diagnosis not present

## 2021-05-18 LAB — CBC WITH DIFFERENTIAL/PLATELET
Abs Immature Granulocytes: 0.04 10*3/uL (ref 0.00–0.07)
Basophils Absolute: 0 10*3/uL (ref 0.0–0.1)
Basophils Relative: 0 %
Eosinophils Absolute: 0 10*3/uL (ref 0.0–0.5)
Eosinophils Relative: 0 %
HCT: 42.7 % (ref 39.0–52.0)
Hemoglobin: 14.5 g/dL (ref 13.0–17.0)
Immature Granulocytes: 1 %
Lymphocytes Relative: 6 %
Lymphs Abs: 0.6 10*3/uL — ABNORMAL LOW (ref 0.7–4.0)
MCH: 32.6 pg (ref 26.0–34.0)
MCHC: 34 g/dL (ref 30.0–36.0)
MCV: 96 fL (ref 80.0–100.0)
Monocytes Absolute: 0.6 10*3/uL (ref 0.1–1.0)
Monocytes Relative: 7 %
Neutro Abs: 7.6 10*3/uL (ref 1.7–7.7)
Neutrophils Relative %: 86 %
Platelets: 243 10*3/uL (ref 150–400)
RBC: 4.45 MIL/uL (ref 4.22–5.81)
RDW: 13.4 % (ref 11.5–15.5)
WBC: 8.8 10*3/uL (ref 4.0–10.5)
nRBC: 0 % (ref 0.0–0.2)

## 2021-05-18 LAB — COMPREHENSIVE METABOLIC PANEL
ALT: 67 U/L — ABNORMAL HIGH (ref 0–44)
AST: 77 U/L — ABNORMAL HIGH (ref 15–41)
Albumin: 2.8 g/dL — ABNORMAL LOW (ref 3.5–5.0)
Alkaline Phosphatase: 56 U/L (ref 38–126)
Anion gap: 8 (ref 5–15)
BUN: 34 mg/dL — ABNORMAL HIGH (ref 8–23)
CO2: 28 mmol/L (ref 22–32)
Calcium: 8.1 mg/dL — ABNORMAL LOW (ref 8.9–10.3)
Chloride: 102 mmol/L (ref 98–111)
Creatinine, Ser: 0.84 mg/dL (ref 0.61–1.24)
GFR, Estimated: >60 mL/min (ref >60)
Glucose, Bld: 143 mg/dL — ABNORMAL HIGH (ref 70–99)
Potassium: 4.5 mmol/L (ref 3.5–5.1)
Sodium: 138 mmol/L (ref 135–145)
Total Bilirubin: 1 mg/dL (ref 0.3–1.2)
Total Protein: 6.6 g/dL (ref 6.5–8.1)

## 2021-05-18 LAB — C-REACTIVE PROTEIN: CRP: 1 mg/dL — ABNORMAL HIGH (ref <1.0)

## 2021-05-18 LAB — D-DIMER, QUANTITATIVE: D-Dimer, Quant: 2.57 ug/mL-FEU — ABNORMAL HIGH (ref 0.00–0.50)

## 2021-05-18 MED ORDER — GUAIFENESIN ER 600 MG PO TB12
1200.0000 mg | ORAL_TABLET | Freq: Two times a day (BID) | ORAL | Status: DC
  Administered 2021-05-18 – 2021-05-27 (×18): 1200 mg via ORAL
  Filled 2021-05-18 (×18): qty 2

## 2021-05-18 MED ORDER — FUROSEMIDE 10 MG/ML IJ SOLN
40.0000 mg | Freq: Once | INTRAMUSCULAR | Status: AC
  Administered 2021-05-18: 40 mg via INTRAVENOUS
  Filled 2021-05-18: qty 4

## 2021-05-18 NOTE — TOC Progression Note (Signed)
Transition of Care [facility]) - Progression Note    Patient Details  Name: Ko Bardon MRN: [id number] Date of Birth: November 29, 1950  Transition of Care [facility] CM/SW Contact  Golda Acre, RN Phone Number: 05/18/2021, 7:28 AM  Clinical Narrative:    Assessment & Plan:   Active Problems:   Pneumonia due to COVID-19 virus   Pneumonia due to COVID-19 virus infection with acute hypoxemic respiratory failure: Patient is still with significant symptoms and hypoxemia.  Currently remains on 15 L high flow oxygen as well as using nonrebreather. He has desired to be no intubation. chest physiotherapy, incentive spirometry, deep breathing exercises, sputum induction, mucolytic's and bronchodilators. Supplemental oxygen to keep saturations more than 90%. Covid directed therapy with , steroids, on high-dose Solu-Medrol continue remdesivir, day 4/5 Started on baricitinib day 3 today.  We will continue for 14 days or until discharge. antibiotics, not indicated Due to severity of symptoms, patient will need daily inflammatory markers, chest x-rays, liver function test to monitor and direct COVID-19 therapies. With elevated D-dimer, CTA of the lung was negative for PE. Remains on high-dose Lovenox. Inflammation markers and D-dimer trending down. Repeat chest x-ray today.   Mild intermittent asthma: With exacerbation due to above.  Symptomatic treatment. TOC PLKAN OF CARE:  to return to home, iv Remdesivir through [id number].  Expected Discharge Plan: Home/Self Care Barriers to Discharge: Continued Medical Work up  Expected Discharge Plan and Services Expected Discharge Plan: Home/Self Care   Discharge Planning Services: CM Consult   Living arrangements for the past 2 months: Single Family Home                                       Social Determinants of Health (SDOH) Interventions    Readmission Risk Interventions No flowsheet data found.

## 2021-05-18 NOTE — Progress Notes (Signed)
[facility]  PROGRESS NOTE  Thomas Hammond NPY:[id number] DOB: [birth year] DOA: 05/13/2021 PCP: Pcp, No   Brief HPI:   70 year old male with history of mild intermittent asthma who was recently vaccinated against COVID-19, presented to the ED with ongoing dyspnea for 5 weeks, worse for past few days.  He was found to be in hypoxemic respiratory failure requiring 10 L/min of oxygen.  Patient was admitted with acute hypoxemic respiratory failure due to COVID-19 pneumonia.    Subjective   Patient still requiring 15 L oxygen via nonrebreather.  Diuresed well around 1.5 L with Lasix given yesterday.   Assessment/Plan:     Acute hypoxemic respiratory failure -Secondary to COVID-19 pneumonia; slow improvement -CRP is down to 1.0, D-dimer 2.57 -Still requiring 15 L/min HFNC NRB; chest x-ray yesterday showed increased bilateral peripheral predominant heterogenous opacities -Continue remdesivir, day 5/5, prednisone 50 mg daily, baricitinib day 4/14 -Initially started on high-dose Lovenox, CTA chest negative for PE.  Check venous duplex of lower extremities, if negative will switch to DVT prophylaxis dose of Lovenox -We will give additional dose of Lasix 40 mg IV x1 -Continue incentive spirometry, flutter valve, vitamin C, zinc, Combivent inhalation every 6 hours      Scheduled medications:    vitamin C  500 mg Oral Daily   baricitinib  4 mg Oral Daily   Chlorhexidine Gluconate Cloth  6 each Topical Daily   enoxaparin (LOVENOX) injection  100 mg Subcutaneous BID   Ipratropium-Albuterol  1 puff Inhalation Q6H   mouth rinse  15 mL Mouth Rinse BID   predniSONE  50 mg Oral Daily   zinc sulfate  220 mg Oral Daily         Data Reviewed:   CBG:  No results for input(s): GLUCAP in the last 168 hours.  SpO2: 97 % O2 Flow Rate (L/min): 15 L/min (plus NRBM) FiO2 (%): 100 %    Vitals:   05/18/21 1200 05/18/21 1229 05/18/21 1300 05/18/21 1400  BP: (!) 90/43  (!) 99/56 124/65   Pulse: 71  70 63  Resp: 19  (!) 22 (!) 28  Temp:  98.1 F (36.7 C)    TempSrc:  Axillary    SpO2: 92%  92% 97%  Weight:      Height:         Intake/Output Summary (Last 24 hours) at 05/18/2021 1443 Last data filed at 05/18/2021 1200 Gross per 24 hour  Intake 1300 ml  Output 2350 ml  Net -1050 ml    07/27 1901 - 07/29 0700 In: 737 [P.O.:720] Out: 1350 [Urine:1350]  Filed Weights   05/13/21 2132  Weight: 110.2 kg    CBC:  Recent Labs  Lab 05/13/21 2152 05/14/21 1605 05/15/21 0242 05/16/21 0240 05/17/21 0301 05/18/21 0251  WBC 7.1 9.6 9.0 9.4 8.0 8.8  HGB 16.3 15.7 14.6 15.3 14.1 14.5  HCT 46.2 45.5 42.8 44.6 41.3 42.7  PLT 260 252 258 303 257 243  MCV 92.8 94.6 94.9 94.3 94.1 96.0  MCH 32.7 32.6 32.4 32.3 32.1 32.6  MCHC 35.3 34.5 34.1 34.3 34.1 34.0  RDW 13.8 14.1 13.9 13.5 13.4 13.4  LYMPHSABS 0.6*  --  0.7 0.6* 0.5* 0.6*  MONOABS 0.3  --  0.2 0.3 0.4 0.6  EOSABS 0.0  --  0.0 0.0 0.0 0.0  BASOSABS 0.0  --  0.0 0.0 0.0 0.0    Complete metabolic panel:  Recent Labs  Lab 05/13/21 2152 05/14/21 1605 05/15/21 0242 05/16/21 0240 05/17/21 0301  05/18/21 0251  NA 134*  --  136 140 139 138  K 3.7  --  4.0 3.9 4.4 4.5  CL 99  --  101 101 103 102  CO2 24  --  22 26 26 28   GLUCOSE 152*  --  159* 177* 163* 143*  BUN 24*  --  39* 44* 37* 34*  CREATININE 1.08 0.90 0.78 0.97 0.88 0.84  CALCIUM 8.5*  --  8.3* 8.7* 8.3* 8.1*  AST  --   --  50* 48* 70* 77*  ALT  --   --  30 34 50* 67*  ALKPHOS  --   --  43 59 57 56  BILITOT  --   --  0.6 0.7 0.9 1.0  ALBUMIN  --   --  2.8* 2.9* 2.7* 2.8*  MG  --   --  2.6*  --   --   --   CRP  --   --  14.4* 5.6* 2.0* 1.0*  DDIMER 3.81*  --  8.65* 4.40* 2.66* 2.57*  PROCALCITON  --  0.63  --   --   --   --     No results for input(s): LIPASE, AMYLASE in the last 168 hours.  Recent Labs  Lab 05/13/21 2152 05/14/21 1605 05/15/21 0242 05/16/21 0240 05/17/21 0301 05/18/21 0251  CRP  --   --  14.4* 5.6* 2.0* 1.0*   DDIMER 3.81*  --  8.65* 4.40* 2.66* 2.57*  PROCALCITON  --  0.63  --   --   --   --       Coagulation profile No results for input(s): INR, PROTIME in the last 168 hours. Recent Labs    05/17/21 0301 05/18/21 0251  DDIMER 2.66* 2.57*    Cardiac Enzymes No results for input(s): CKTOTAL, CKMB, CKMBINDEX, TROPONINI in the last 168 hours.  ------------------------------------------------------------------------------------------------------------------    Component Value Date/Time   BNP 11.8 05/09/2021 1433     Antibiotics: Anti-infectives (From admission, onward)    Start     Dose/Rate Route Frequency Ordered Stop   05/18/21 1000  remdesivir 100 mg in sodium chloride 0.9 % 100 mL IVPB        100 mg 200 mL/hr over 30 Minutes Intravenous Daily 05/17/21 1203 05/18/21 1042   05/15/21 1000  remdesivir 100 mg in sodium chloride 0.9 % 100 mL IVPB  Status:  Discontinued        100 mg 200 mL/hr over 30 Minutes Intravenous Daily 05/13/21 2353 05/17/21 1204   05/14/21 0000  remdesivir 100 mg in sodium chloride 0.9 % 100 mL IVPB        100 mg 200 mL/hr over 30 Minutes Intravenous Every 30 min 05/13/21 2353 05/14/21 0039        Radiology Reports  DG CHEST PORT 1 VIEW  Result Date: 05/17/2021 CLINICAL DATA:  Shortness of breath, cough, history of COVID EXAM: PORTABLE CHEST 1 VIEW COMPARISON:  May 13, 2021 FINDINGS: The cardiomediastinal silhouette is unchanged in contour. No pleural effusion. No pneumothorax. Increased bilateral peripheral predominant heterogeneous opacities, consistent with the sequela of COVID-19 infection. Visualized abdomen is unremarkable. Multilevel degenerative changes of the thoracic spine. Status post LEFT rotator cuff repair. IMPRESSION: Increased bilateral peripheral predominant heterogeneous opacities, consistent with the sequela of COVID-19 infection. Electronically Signed   By: May 15, 2021 MD   On: 05/17/2021 12:49      DVT prophylaxis:  Lovenox  Code Status: Partial code  Family Communication: No family at bedside  Consultants:   Procedures:     Objective    Physical Examination:  General-appears in no acute distress Heart-S1-S2, regular, no murmur auscultated Lungs-bilateral crackles auscultated at lung bases Abdomen-soft, nontender, no organomegaly Extremities-no edema in the lower extremities Neuro-alert, oriented x3, no focal deficit noted  Status is: Inpatient  Dispo: The patient is from: Home              Anticipated d/c is to: Home versus skilled nursing facility              Anticipated d/c date is: 05/25/2021              Patient currently not stable for discharge  Barrier to discharge-ongoing treatment for acute hypoxemic respiratory failure due to COVID-19 pneumonia  COVID-19 Labs  Recent Labs    05/16/21 0240 05/17/21 0301 05/18/21 0251  DDIMER 4.40* 2.66* 2.57*  CRP 5.6* 2.0* 1.0*    Lab Results  Component Value Date   SARSCOV2NAA POSITIVE (A) 05/09/2021    Microbiology  Recent Results (from the past 240 hour(s))  Resp Panel by RT-PCR (Flu A&B, Covid) Nasopharyngeal Swab     Status: Abnormal   Collection Time: 05/09/21  2:33 PM   Specimen: Nasopharyngeal Swab; Nasopharyngeal(NP) swabs in vial transport medium  Result Value Ref Range Status   SARS Coronavirus 2 by RT PCR POSITIVE (A) NEGATIVE Final    Comment: RESULT CALLED TO, READ BACK BY AND VERIFIED WITH: JERRI PEGRAM RN ON 05/09/21 AT 1616 SRC (NOTE) SARS-CoV-2 target nucleic acids are DETECTED.  The SARS-CoV-2 RNA is generally detectable in upper respiratory specimens during the acute phase of infection. Positive results are indicative of the presence of the identified virus, but do not rule out bacterial infection or co-infection with other pathogens not detected by the test. Clinical correlation with patient history and other diagnostic information is necessary to determine patient infection status. The  expected result is Negative.  Fact Sheet for Patients: BloggerCourse.comhttps://www.fda.gov/media/152166/download  Fact Sheet for Healthcare Providers: SeriousBroker.ithttps://www.fda.gov/media/152162/download  This test is not yet approved or cleared by the [place] FDA and  has been authorized for detection and/or diagnosis of SARS-CoV-2 by FDA under an Emergency Use Authorization (EUA).  This EUA will remain in effect (meaning this test c an be used) for the duration of  the COVID-19 declaration under Section 564(b)(1) of the Act, 21 U.S.C. section 360bbb-3(b)(1), unless the authorization is terminated or revoked sooner.     Influenza A by PCR NEGATIVE NEGATIVE Final   Influenza B by PCR NEGATIVE NEGATIVE Final    Comment: (NOTE) The Xpert Xpress SARS-CoV-2/FLU/RSV plus assay is intended as an aid in the diagnosis of influenza from Nasopharyngeal swab specimens and should not be used as a sole basis for treatment. Nasal washings and aspirates are unacceptable for Xpert Xpress SARS-CoV-2/FLU/RSV testing.  Fact Sheet for Patients: BloggerCourse.comhttps://www.fda.gov/media/152166/download  Fact Sheet for Healthcare Providers: SeriousBroker.ithttps://www.fda.gov/media/152162/download  This test is not yet approved or cleared by the [place] FDA and has been authorized for detection and/or diagnosis of SARS-CoV-2 by FDA under an Emergency Use Authorization (EUA). This EUA will remain in effect (meaning this test can be used) for the duration of the COVID-19 declaration under Section 564(b)(1) of the Act, 21 U.S.C. section 360bbb-3(b)(1), unless the authorization is terminated or revoked.  Performed at [facility] [place], [address] [place], [place] [address]   MRSA Next Gen by PCR, Nasal     Status: None   Collection Time: 05/14/21  4:06 PM   Specimen: Nasal Mucosa; Nasal Swab  Result Value Ref Range Status   MRSA by PCR Next Gen NOT DETECTED NOT DETECTED Final    Comment: (NOTE) The GeneXpert MRSA Assay (FDA  approved for NASAL specimens only), is one component of a comprehensive MRSA colonization surveillance program. It is not intended to diagnose MRSA infection nor to guide or monitor treatment for MRSA infections. Test performance is not FDA approved in patients less than 63 years old. Performed at [facility], [address] [place], [place] [address]              Meredeth Ide   [facility] If 7PM-7AM, please contact night-coverage at [email or web address], Office  [phone]   05/18/2021, 2:43 PM  LOS: 4 days

## 2021-05-18 NOTE — Progress Notes (Signed)
Bilateral lower extremity venous duplex completed. Refer to "CV Proc" under chart review to view preliminary results.  05/18/2021 4:05 PM Eula Fried., MHA, RVT, RDCS, RDMS

## 2021-05-19 DIAGNOSIS — R0902 Hypoxemia: Secondary | ICD-10-CM | POA: Diagnosis not present

## 2021-05-19 DIAGNOSIS — J1282 Pneumonia due to coronavirus disease 2019: Secondary | ICD-10-CM | POA: Diagnosis not present

## 2021-05-19 DIAGNOSIS — U071 COVID-19: Secondary | ICD-10-CM | POA: Diagnosis not present

## 2021-05-19 LAB — D-DIMER, QUANTITATIVE: D-Dimer, Quant: 2.54 ug/mL-FEU — ABNORMAL HIGH (ref 0.00–0.50)

## 2021-05-19 LAB — CBC WITH DIFFERENTIAL/PLATELET
Abs Immature Granulocytes: 0.08 10*3/uL — ABNORMAL HIGH (ref 0.00–0.07)
Basophils Absolute: 0 10*3/uL (ref 0.0–0.1)
Basophils Relative: 0 %
Eosinophils Absolute: 0 10*3/uL (ref 0.0–0.5)
Eosinophils Relative: 1 %
HCT: 42.9 % (ref 39.0–52.0)
Hemoglobin: 14.7 g/dL (ref 13.0–17.0)
Immature Granulocytes: 1 %
Lymphocytes Relative: 10 %
Lymphs Abs: 0.8 10*3/uL (ref 0.7–4.0)
MCH: 32.7 pg (ref 26.0–34.0)
MCHC: 34.3 g/dL (ref 30.0–36.0)
MCV: 95.3 fL (ref 80.0–100.0)
Monocytes Absolute: 0.2 10*3/uL (ref 0.1–1.0)
Monocytes Relative: 3 %
Neutro Abs: 6.4 10*3/uL (ref 1.7–7.7)
Neutrophils Relative %: 85 %
Platelets: 260 10*3/uL (ref 150–400)
RBC: 4.5 MIL/uL (ref 4.22–5.81)
RDW: 13.2 % (ref 11.5–15.5)
WBC: 7.6 10*3/uL (ref 4.0–10.5)
nRBC: 0 % (ref 0.0–0.2)

## 2021-05-19 LAB — COMPREHENSIVE METABOLIC PANEL
ALT: 87 U/L — ABNORMAL HIGH (ref 0–44)
AST: 76 U/L — ABNORMAL HIGH (ref 15–41)
Albumin: 2.8 g/dL — ABNORMAL LOW (ref 3.5–5.0)
Alkaline Phosphatase: 54 U/L (ref 38–126)
Anion gap: 9 (ref 5–15)
BUN: 30 mg/dL — ABNORMAL HIGH (ref 8–23)
CO2: 28 mmol/L (ref 22–32)
Calcium: 8.1 mg/dL — ABNORMAL LOW (ref 8.9–10.3)
Chloride: 101 mmol/L (ref 98–111)
Creatinine, Ser: 0.77 mg/dL (ref 0.61–1.24)
GFR, Estimated: >60 mL/min (ref >60)
Glucose, Bld: 101 mg/dL — ABNORMAL HIGH (ref 70–99)
Potassium: 4.3 mmol/L (ref 3.5–5.1)
Sodium: 138 mmol/L (ref 135–145)
Total Bilirubin: 1 mg/dL (ref 0.3–1.2)
Total Protein: 6.6 g/dL (ref 6.5–8.1)

## 2021-05-19 LAB — C-REACTIVE PROTEIN: CRP: 1.2 mg/dL — ABNORMAL HIGH (ref <1.0)

## 2021-05-19 MED ORDER — BENZONATATE 100 MG PO CAPS
200.0000 mg | ORAL_CAPSULE | Freq: Once | ORAL | Status: AC
  Administered 2021-05-19: 200 mg via ORAL
  Filled 2021-05-19: qty 2

## 2021-05-19 MED ORDER — ENOXAPARIN SODIUM 60 MG/0.6ML IJ SOSY
0.5000 mg/kg | PREFILLED_SYRINGE | INTRAMUSCULAR | Status: DC
  Administered 2021-05-19: 55 mg via SUBCUTANEOUS
  Filled 2021-05-19: qty 0.55

## 2021-05-19 NOTE — Progress Notes (Signed)
[facility]  PROGRESS NOTE  Thomas CongoMark Hammond AOZ:[id number]:[id number] DOB: [birth year] DOA: 05/13/2021 PCP: Pcp, No   Brief HPI:   70 year old male with history of mild intermittent asthma who was recently vaccinated against COVID-19, presented to the ED with ongoing dyspnea for 5 weeks, worse for past few days.  He was found to be in hypoxemic respiratory failure requiring 10 L/min of oxygen.  Patient was admitted with acute hypoxemic respiratory failure due to COVID-19 pneumonia.    Subjective   Patient seen and examined, breathing has improved a little as per patient.  He diuresed well with IV Lasix around 2 L.   Assessment/Plan:     Acute hypoxemic respiratory failure -Secondary to COVID-19 pneumonia; slow improvement -CRP is down to 1.2, D-dimer 2.54 -Still requiring 15 L/min HFNC NRB; chest x-ray yesterday showed increased bilateral peripheral predominant heterogenous opacities -He was given IV Lasix 40 mg IV for last 2 days and has diuresed well. -We will repeat chest x-ray in a.m. -Continue remdesivir, day 5/5, prednisone 50 mg daily, baricitinib day 4/14 -Initially started on high-dose Lovenox, CTA chest negative for PE.  Venous duplex of lower extremities was negative for DVT.  Lovenox dose has been switched to DVT prophylaxis dose.   -Continue incentive spirometry, flutter valve, vitamin C, zinc, Combivent inhalation every 6 hours      Scheduled medications:    vitamin C  500 mg Oral Daily   baricitinib  4 mg Oral Daily   Chlorhexidine Gluconate Cloth  6 each Topical Daily   enoxaparin (LOVENOX) injection  0.5 mg/kg Subcutaneous Q24H   guaiFENesin  1,200 mg Oral BID   Ipratropium-Albuterol  1 puff Inhalation Q6H   mouth rinse  15 mL Mouth Rinse BID   predniSONE  50 mg Oral Daily   zinc sulfate  220 mg Oral Daily         Data Reviewed:   CBG:  No results for input(s): GLUCAP in the last 168 hours.  SpO2: 96 % O2 Flow Rate (L/min): 15 L/min FiO2 (%): 100 %     Vitals:   05/19/21 0900 05/19/21 1000 05/19/21 1100 05/19/21 1200  BP:    111/69  Pulse: 78 82 76 71  Resp: (!) 26 (!) 28 (!) 22 (!) 27  Temp:    98.3 F (36.8 C)  TempSrc:    Oral  SpO2: 95% 93% 93% 96%  Weight:      Height:         Intake/Output Summary (Last 24 hours) at 05/19/2021 1340 Last data filed at 05/19/2021 0800 Gross per 24 hour  Intake 240 ml  Output 1000 ml  Net -760 ml    07/28 1901 - 07/30 0700 In: 820 [P.O.:720] Out: 2400 [Urine:2400]  Filed Weights   05/13/21 2132  Weight: 110.2 kg    CBC:  Recent Labs  Lab 05/15/21 0242 05/16/21 0240 05/17/21 0301 05/18/21 0251 05/19/21 0243  WBC 9.0 9.4 8.0 8.8 7.6  HGB 14.6 15.3 14.1 14.5 14.7  HCT 42.8 44.6 41.3 42.7 42.9  PLT 258 303 257 243 260  MCV 94.9 94.3 94.1 96.0 95.3  MCH 32.4 32.3 32.1 32.6 32.7  MCHC 34.1 34.3 34.1 34.0 34.3  RDW 13.9 13.5 13.4 13.4 13.2  LYMPHSABS 0.7 0.6* 0.5* 0.6* 0.8  MONOABS 0.2 0.3 0.4 0.6 0.2  EOSABS 0.0 0.0 0.0 0.0 0.0  BASOSABS 0.0 0.0 0.0 0.0 0.0    Complete metabolic panel:  Recent Labs  Lab 05/14/21 1605 05/15/21 0242 05/16/21  0240 05/17/21 0301 05/18/21 0251 05/19/21 0243  NA  --  136 140 139 138 138  K  --  4.0 3.9 4.4 4.5 4.3  CL  --  101 101 103 102 101  CO2  --  GLUCOSE  --  159* 177* 163* 143* 101*  BUN  --  39* 44* 37* 34* 30*  CREATININE 0.90 0.78 0.97 0.88 0.84 0.77  CALCIUM  --  8.3* 8.7* 8.3* 8.1* 8.1*  AST  --  50* 48* 70* 77* 76*  ALT  --  30 34 50* 67* 87*  ALKPHOS  --  43 59 57 56 54  BILITOT  --  0.6 0.7 0.9 1.0 1.0  ALBUMIN  --  2.8* 2.9* 2.7* 2.8* 2.8*  MG  --  2.6*  --   --   --   --   CRP  --  14.4* 5.6* 2.0* 1.0* 1.2*  DDIMER  --  8.65* 4.40* 2.66* 2.57* 2.54*  PROCALCITON 0.63  --   --   --   --   --     No results for input(s): LIPASE, AMYLASE in the last 168 hours.  Recent Labs  Lab 05/14/21 1605 05/15/21 0242 05/16/21 0240 05/17/21 0301 05/18/21 0251 05/19/21 0243  CRP  --  14.4* 5.6*  2.0* 1.0* 1.2*  DDIMER  --  8.65* 4.40* 2.66* 2.57* 2.54*  PROCALCITON 0.63  --   --   --   --   --       Coagulation profile No results for input(s): INR, PROTIME in the last 168 hours. Recent Labs    05/18/21 0251 05/19/21 0243  DDIMER 2.57* 2.54*    Cardiac Enzymes No results for input(s): CKTOTAL, CKMB, CKMBINDEX, TROPONINI in the last 168 hours.  ------------------------------------------------------------------------------------------------------------------    Component Value Date/Time   BNP 11.8 05/09/2021 1433     Antibiotics: Anti-infectives (From admission, onward)    Start     Dose/Rate Route Frequency Ordered Stop   05/18/21 1000  remdesivir 100 mg in sodium chloride 0.9 % 100 mL IVPB        100 mg 200 mL/hr over 30 Minutes Intravenous Daily 05/17/21 1203 05/18/21 1042   05/15/21 1000  remdesivir 100 mg in sodium chloride 0.9 % 100 mL IVPB  Status:  Discontinued        100 mg 200 mL/hr over 30 Minutes Intravenous Daily 05/13/21 2353 05/17/21 1204   05/14/21 0000  remdesivir 100 mg in sodium chloride 0.9 % 100 mL IVPB        100 mg 200 mL/hr over 30 Minutes Intravenous Every 30 min 05/13/21 2353 05/14/21 0039        Radiology Reports  VAS [place] LOWER EXTREMITY VENOUS (DVT)  Result Date: 05/18/2021  Lower Venous DVT Study Patient Name:  Thomas Hammond  Date of Exam:   05/18/2021 Medical Rec #: [id number]    Accession #:    [id number] Date of Birth: Feb 14, 1951   Patient Gender: M Patient Age:   69Y Exam Location:  [facility] Procedure:      VAS [place] LOWER EXTREMITY VENOUS (DVT) Referring Phys: Gomez Cleverly Izzabelle Bouley --------------------------------------------------------------------------------  Indications: Elevated d-dimer, COVID.  Comparison Study: No prior study Performing Technologist: Gertie Fey MHA, RDMS, RVT, RDCS  Examination Guidelines: A complete evaluation includes B-mode imaging, spectral Doppler, color Doppler, and power Doppler as needed of  all accessible portions of each vessel. Bilateral testing is considered an integral  part of a complete examination. Limited examinations for reoccurring indications may be performed as noted. The reflux portion of the exam is performed with the patient in reverse Trendelenburg.  +---------+---------------+---------+-----------+----------+--------------+ RIGHT    CompressibilityPhasicitySpontaneityPropertiesThrombus Aging +---------+---------------+---------+-----------+----------+--------------+ CFV      Full           Yes      Yes                                 +---------+---------------+---------+-----------+----------+--------------+ SFJ      Full                                                        +---------+---------------+---------+-----------+----------+--------------+ FV Prox  Full                                                        +---------+---------------+---------+-----------+----------+--------------+ FV Mid   Full                                                        +---------+---------------+---------+-----------+----------+--------------+ FV DistalFull                                                        +---------+---------------+---------+-----------+----------+--------------+ PFV      Full                                                        +---------+---------------+---------+-----------+----------+--------------+ POP      Full           Yes      Yes                                 +---------+---------------+---------+-----------+----------+--------------+ PTV      Full                                                        +---------+---------------+---------+-----------+----------+--------------+ PERO     Full                                                        +---------+---------------+---------+-----------+----------+--------------+    +---------+---------------+---------+-----------+----------+--------------+ LEFT     CompressibilityPhasicitySpontaneityPropertiesThrombus Aging +---------+---------------+---------+-----------+----------+--------------+ CFV  Full           Yes      Yes                                 +---------+---------------+---------+-----------+----------+--------------+ SFJ      Full                                                        +---------+---------------+---------+-----------+----------+--------------+ FV Prox  Full                                                        +---------+---------------+---------+-----------+----------+--------------+ FV Mid   Full                                                        +---------+---------------+---------+-----------+----------+--------------+ FV DistalFull                                                        +---------+---------------+---------+-----------+----------+--------------+ PFV      Full                                                        +---------+---------------+---------+-----------+----------+--------------+ POP      Full           Yes      Yes                                 +---------+---------------+---------+-----------+----------+--------------+ PTV      Full                                                        +---------+---------------+---------+-----------+----------+--------------+ PERO     Full                                                        +---------+---------------+---------+-----------+----------+--------------+     Summary: RIGHT: - There is no evidence of deep vein thrombosis in the lower extremity.  - No cystic structure found in the popliteal fossa.  LEFT: - There is no evidence of deep vein thrombosis in the lower extremity.  - No cystic structure found in the popliteal fossa.  *See table(s) above for measurements and observations.  Electronically signed  by Waverly Ferrari MD on 05/18/2021 at 5:23:14 PM.    Final       DVT prophylaxis: Lovenox  Code Status: Partial code  Family Communication: No family at bedside   Consultants:   Procedures:     Objective    Physical Examination:  General-appears in no acute distress Heart-S1-S2, regular, no murmur auscultated Lungs-clear to auscultation bilaterally Abdomen-soft, nontender, no organomegaly Extremities-no edema in the lower extremities Neuro-alert, oriented x3, no focal deficit noted  Status is: Inpatient  Dispo: The patient is from: Home              Anticipated d/c is to: Home versus skilled nursing facility              Anticipated d/c date is: 05/25/2021              Patient currently not stable for discharge  Barrier to discharge-ongoing treatment for acute hypoxemic respiratory failure due to COVID-19 pneumonia  COVID-19 Labs  Recent Labs    05/17/21 0301 05/18/21 0251 05/19/21 0243  DDIMER 2.66* 2.57* 2.54*  CRP 2.0* 1.0* 1.2*    Lab Results  Component Value Date   SARSCOV2NAA POSITIVE (A) 05/09/2021    Microbiology  Recent Results (from the past 240 hour(s))  Resp Panel by RT-PCR (Flu A&B, Covid) Nasopharyngeal Swab     Status: Abnormal   Collection Time: 05/09/21  2:33 PM   Specimen: Nasopharyngeal Swab; Nasopharyngeal(NP) swabs in vial transport medium  Result Value Ref Range Status   SARS Coronavirus 2 by RT PCR POSITIVE (A) NEGATIVE Final    Comment: RESULT CALLED TO, READ BACK BY AND VERIFIED WITH: JERRI PEGRAM RN ON 05/09/21 AT 1616 SRC (NOTE) SARS-CoV-2 target nucleic acids are DETECTED.  The SARS-CoV-2 RNA is generally detectable in upper respiratory specimens during the acute phase of infection. Positive results are indicative of the presence of the identified virus, but do not rule out bacterial infection or co-infection with other pathogens not detected by the test. Clinical correlation with patient history and other  diagnostic information is necessary to determine patient infection status. The expected result is Negative.  Fact Sheet for Patients: BloggerCourse.com  Fact Sheet for Healthcare Providers: SeriousBroker.it  This test is not yet approved or cleared by the [place] FDA and  has been authorized for detection and/or diagnosis of SARS-CoV-2 by FDA under an Emergency Use Authorization (EUA).  This EUA will remain in effect (meaning this test c an be used) for the duration of  the COVID-19 declaration under Section 564(b)(1) of the Act, 21 U.S.C. section 360bbb-3(b)(1), unless the authorization is terminated or revoked sooner.     Influenza A by PCR NEGATIVE NEGATIVE Final   Influenza B by PCR NEGATIVE NEGATIVE Final    Comment: (NOTE) The Xpert Xpress SARS-CoV-2/FLU/RSV plus assay is intended as an aid in the diagnosis of influenza from Nasopharyngeal swab specimens and should not be used as a sole basis for treatment. Nasal washings and aspirates are unacceptable for Xpert Xpress SARS-CoV-2/FLU/RSV testing.  Fact Sheet for Patients: BloggerCourse.com  Fact Sheet for Healthcare Providers: SeriousBroker.it  This test is not yet approved or cleared by the [place] FDA and has been authorized for detection and/or diagnosis of SARS-CoV-2 by FDA under an Emergency Use Authorization (EUA). This EUA will remain in effect (meaning this test can be used) for the duration of the COVID-19 declaration under Section 564(b)(1) of the Act, 21 U.S.C. section 360bbb-3(b)(1), unless the authorization is  terminated or revoked.  Performed at [facility], [address] [place], [place] [address]   MRSA Next Gen by PCR, Nasal     Status: None   Collection Time: 05/14/21  4:06 PM   Specimen: Nasal Mucosa; Nasal Swab  Result Value Ref Range Status   MRSA by PCR Next Gen NOT  DETECTED NOT DETECTED Final    Comment: (NOTE) The GeneXpert MRSA Assay (FDA approved for NASAL specimens only), is one component of a comprehensive MRSA colonization surveillance program. It is not intended to diagnose MRSA infection nor to guide or monitor treatment for MRSA infections. Test performance is not FDA approved in patients less than 17 years old. Performed at [facility], [address], [place], [place] [address]              Meredeth Ide   [facility] If 7PM-7AM, please contact night-coverage at [email or web address], Office  [phone]   05/19/2021, 1:40 PM  LOS: 5 days

## 2021-05-20 DIAGNOSIS — U071 COVID-19: Secondary | ICD-10-CM | POA: Diagnosis not present

## 2021-05-20 DIAGNOSIS — J1282 Pneumonia due to coronavirus disease 2019: Secondary | ICD-10-CM | POA: Diagnosis not present

## 2021-05-20 LAB — BASIC METABOLIC PANEL
Anion gap: 6 (ref 5–15)
BUN: 21 mg/dL (ref 8–23)
CO2: 31 mmol/L (ref 22–32)
Calcium: 8.2 mg/dL — ABNORMAL LOW (ref 8.9–10.3)
Chloride: 97 mmol/L — ABNORMAL LOW (ref 98–111)
Creatinine, Ser: 0.88 mg/dL (ref 0.61–1.24)
GFR, Estimated: >60 mL/min (ref >60)
Glucose, Bld: 97 mg/dL (ref 70–99)
Potassium: 4.5 mmol/L (ref 3.5–5.1)
Sodium: 134 mmol/L — ABNORMAL LOW (ref 135–145)

## 2021-05-20 LAB — BRAIN NATRIURETIC PEPTIDE
B Natriuretic Peptide: 41.1 pg/mL (ref 0.0–100.0)
B Natriuretic Peptide: 23.5 pg/mL (ref 0.0–100.0)

## 2021-05-20 LAB — MAGNESIUM: Magnesium: 2.4 mg/dL (ref 1.7–2.4)

## 2021-05-20 MED ORDER — PREDNISONE 50 MG PO TABS
60.0000 mg | ORAL_TABLET | Freq: Every day | ORAL | Status: AC
  Administered 2021-05-23 – 2021-05-24 (×2): 60 mg via ORAL
  Filled 2021-05-20 (×2): qty 1

## 2021-05-20 MED ORDER — LACTATED RINGERS IV BOLUS
500.0000 mL | Freq: Once | INTRAVENOUS | Status: AC
  Administered 2021-05-20: 500 mL via INTRAVENOUS

## 2021-05-20 MED ORDER — PREDNISONE 20 MG PO TABS
30.0000 mg | ORAL_TABLET | Freq: Every day | ORAL | Status: DC
  Administered 2021-05-27: 30 mg via ORAL
  Filled 2021-05-20: qty 1

## 2021-05-20 MED ORDER — METHYLPREDNISOLONE SODIUM SUCC 125 MG IJ SOLR
60.0000 mg | Freq: Two times a day (BID) | INTRAMUSCULAR | Status: AC
  Administered 2021-05-20 – 2021-05-22 (×6): 60 mg via INTRAVENOUS
  Filled 2021-05-20 (×6): qty 2

## 2021-05-20 MED ORDER — PREDNISONE 20 MG PO TABS: 20.0000 mg | ORAL_TABLET | Freq: Every day | ORAL | Status: DC

## 2021-05-20 MED ORDER — PREDNISONE 20 MG PO TABS
40.0000 mg | ORAL_TABLET | Freq: Every day | ORAL | Status: AC
  Administered 2021-05-25 – 2021-05-26 (×2): 40 mg via ORAL
  Filled 2021-05-20 (×2): qty 2

## 2021-05-20 MED ORDER — ENOXAPARIN SODIUM 60 MG/0.6ML IJ SOSY
0.5000 mg/kg | PREFILLED_SYRINGE | INTRAMUSCULAR | Status: DC
  Administered 2021-05-20 – 2021-05-25 (×6): 55 mg via SUBCUTANEOUS
  Filled 2021-05-20 (×3): qty 0.6
  Filled 2021-05-20: qty 0.55
  Filled 2021-05-20 (×2): qty 0.6
  Filled 2021-05-20: qty 0.55

## 2021-05-20 MED ORDER — FUROSEMIDE 10 MG/ML IJ SOLN
40.0000 mg | Freq: Once | INTRAMUSCULAR | Status: AC
  Administered 2021-05-20: 40 mg via INTRAVENOUS
  Filled 2021-05-20: qty 4

## 2021-05-20 MED ORDER — METHYLPREDNISOLONE SODIUM SUCC 125 MG IJ SOLR: 60.0000 mg | Freq: Two times a day (BID) | INTRAMUSCULAR | Status: DC

## 2021-05-20 MED ORDER — PREDNISONE 10 MG PO TABS: 10.0000 mg | ORAL_TABLET | Freq: Every day | ORAL | Status: DC

## 2021-05-20 NOTE — Progress Notes (Addendum)
Notified Hospitalist of SBP 80s- low 100s and MAP mid 50s-60s this morning.  Patient denies dizziness or lightheaded.  Patient appears asymptomatic, alert and oriented, and able to move with standby assist to chair and BSC x 3.   Per Dr. Lowell Guitar, patient to receive 500cc LR bolus and continue monitoring.  Patient also taken off of nonrebreather mask and increased HFNC to 15L per Dr. Lowell Guitar.  Patient is able to maintain O2 sats of 91-94%.   Blood pressure has improved post-bolus.

## 2021-05-20 NOTE — Progress Notes (Addendum)
PROGRESS NOTE    Thomas CongoMark Hammond  ZOX:[id number] DOB: 1951/03/03 DOA: 05/13/2021 PCP: Pcp, No   Chief Complaint  Patient presents with   Shortness of Breath   Brief Narrative:  70 year old male with history of mild intermittent asthma who was recently vaccinated against COVID-19, presented to the ED with ongoing dyspnea for 5 weeks, worse for past few days.  He was found to be in hypoxemic respiratory failure requiring 10 L/min of oxygen.  Patient was admitted with acute hypoxemic respiratory failure due to COVID-19 pneumonia.  Assessment & Plan:   Active Problems:   Pneumonia due to COVID-19 virus  Addendum Hypotension More hypotensive today, received lasix - follow with small bolus.  Seems to be asymptomatic.  Acute Hypoxic Respiratory Failure  COVID 19 Pneumonia  Currently on NRB + 10 L HFNC CXR 7/28 with bilateral peripheral predominant heterogenous opacities Continue steroids (7/24-present - will need taper after 10 days), baricitinib (day 7 out of 14) Completed course of remdesivir Lasix as tolerated for goal net negative/even Strict I/O, daily weights Procal 7/25 0.63, borderline - suspect presentation is related to covid and less likely bacterial infection, continue to hold abx Daily inflammatory markers OOB, IS, flutter, therapy   COVID-19 Labs  Recent Labs    05/18/21 0251 05/19/21 0243  DDIMER 2.57* 2.54*  CRP 1.0* 1.2*    Lab Results  Component Value Date   SARSCOV2NAA POSITIVE (Jalila Goodnough) 05/09/2021   Elevated D dimer 2/2 covid 19 infection - CT PE protocol on 7/24 negative for PE and LE US on 7/29 negative for DVT  Elevated LFT's  Hx Cholecystectomy Likely 2/2 covid 19 infection Follow acute hepatitis panel, reports hx of  cholecystectomy  History of asthma No wheezing, continue steroids and inhalers  DVT prophylaxis: lovenox Code Status: partial  Family Communication: none at bedside Disposition:   Status is: Inpatient  Remains inpatient  appropriate because:Inpatient level of care appropriate due to severity of illness  Dispo: The patient is from: Home              Anticipated d/c is to: Home              Patient currently is not medically stable to d/c.   Difficult to place patient No       Consultants:  LE US Summary:  RIGHT:  - There is no evidence of deep vein thrombosis in the lower extremity.     - No cystic structure found in the popliteal fossa.     LEFT:  - There is no evidence of deep vein thrombosis in the lower extremity.     - No cystic structure found in the popliteal fossa.      Procedures:   Antimicrobials: Anti-infectives (From admission, onward)    Start     Dose/Rate Route Frequency Ordered Stop   05/18/21 1000  remdesivir 100 mg in sodium chloride 0.9 % 100 mL IVPB        100 mg 200 mL/hr over 30 Minutes Intravenous Daily 05/17/21 1203 05/18/21 1042   05/15/21 1000  remdesivir 100 mg in sodium chloride 0.9 % 100 mL IVPB  Status:  Discontinued        100 mg 200 mL/hr over 30 Minutes Intravenous Daily 05/13/21 2353 05/17/21 1204   05/14/21 0000  remdesivir 100 mg in sodium chloride 0.9 % 100 mL IVPB        100 mg 200 mL/hr over 30 Minutes Intravenous Every 30 min 05/13/21 2353 05/14/21 0039  Subjective: No new complaints Overall says he's feeling better  Objective: Vitals:   05/20/21 0400 05/20/21 0500 05/20/21 0600 05/20/21 0834  BP: (!) 112/27  (!) 83/42   Pulse: 67 62 (!) 55   Resp: (!) 21 17 18    Temp:    98.3 F (36.8 C)  TempSrc:    Oral  SpO2: 95% 95% 95%   Weight:      Height:        Intake/Output Summary (Last 24 hours) at 05/20/2021 0853 Last data filed at 05/20/2021 0232 Gross per 24 hour  Intake 340 ml  Output 1000 ml  Net -660 ml   Filed Weights   05/13/21 2132  Weight: 110.2 kg    Examination:  General exam: Appears calm and comfortable  Respiratory system: unlabored, no clear adventitious lung sounds on isolation scope - on [place] and  NRB Cardiovascular system: S1 & S2 heard, RRR.  Gastrointestinal system: Abdomen is nondistended, soft and nontender. Central nervous system: Alert and oriented. No focal neurological deficits. Extremities: no LEE Skin: No rashes, lesions or ulcers Psychiatry: Judgement and insight appear normal. Mood & affect appropriate.     Data Reviewed: I have personally reviewed following labs and imaging studies  CBC: Recent Labs  Lab 05/15/21 0242 05/16/21 0240 05/17/21 0301 05/18/21 0251 05/19/21 0243  WBC 9.0 9.4 8.0 8.8 7.6  NEUTROABS 8.0* 8.4* 7.1 7.6 6.4  HGB 14.6 15.3 14.1 14.5 14.7  HCT 42.8 44.6 41.3 42.7 42.9  MCV 94.9 94.3 94.1 96.0 95.3  PLT 258 303 257 243 260    Basic Metabolic Panel: Recent Labs  Lab 05/15/21 0242 05/16/21 0240 05/17/21 0301 05/18/21 0251 05/19/21 0243 05/20/21 0233  NA 136 140 139 138 138 134*  K 4.0 3.9 4.4 4.5 4.3 4.5  CL 101 101 103 102 101 97*  CO2 22 26 26 28 28 31   GLUCOSE 159* 177* 163* 143* 101* 97  BUN 39* 44* 37* 34* 30* 21  CREATININE 0.78 0.97 0.88 0.84 0.77 0.88  CALCIUM 8.3* 8.7* 8.3* 8.1* 8.1* 8.2*  MG 2.6*  --   --   --   --  2.4  PHOS 4.2  --   --   --   --   --     GFR: Estimated Creatinine Clearance: 92.3 mL/min (by C-G formula based on SCr of 0.88 mg/dL).  Liver Function Tests: Recent Labs  Lab 05/15/21 0242 05/16/21 0240 05/17/21 0301 05/18/21 0251 05/19/21 0243  AST 50* 48* 70* 77* 76*  ALT 30 34 50* 67* 87*  ALKPHOS 43 59 57 56 54  BILITOT 0.6 0.7 0.9 1.0 1.0  PROT 7.1 7.5 6.9 6.6 6.6  ALBUMIN 2.8* 2.9* 2.7* 2.8* 2.8*    CBG: No results for input(s): GLUCAP in the last 168 hours.   Recent Results (from the past 240 hour(s))  MRSA Next Gen by PCR, Nasal     Status: None   Collection Time: 05/14/21  4:06 PM   Specimen: Nasal Mucosa; Nasal Swab  Result Value Ref Range Status   MRSA by PCR Next Gen NOT DETECTED NOT DETECTED Final    Comment: (NOTE) The GeneXpert MRSA Assay (FDA approved for NASAL  specimens only), is one component of Kyesha Balla comprehensive MRSA colonization surveillance program. It is not intended to diagnose MRSA infection nor to guide or monitor treatment for MRSA infections. Test performance is not FDA approved in patients less than 53 years old. Performed at [facility], [address], [place], [place] [address]          Radiology Studies: VAS [place] LOWER EXTREMITY VENOUS (DVT)  Result Date: 05/18/2021  Lower Venous DVT Study Patient Name:  Thomas Hammond  Date of Exam:   05/18/2021 Medical Rec #: [id number]    Accession #:    [id number] Date of Birth: [birth year]   Patient Gender: M Patient Age:   69Y Exam Location:  [facility] Procedure:      VAS [place] LOWER EXTREMITY VENOUS (DVT) Referring Phys: [id number]ina Ill LAMA --------------------------------------------------------------------------------  Indications: Elevated d-dimer, COVID.  Comparison Study: No prior study Performing Technologist: Gertie Fey MHA, RDMS, RVT, RDCS  Examination Guidelines: Tlaloc Taddei complete evaluation includes B-mode imaging, spectral Doppler, color Doppler, and power Doppler as needed of all accessible portions of each vessel. Bilateral testing is considered an integral part of Lambros Cerro complete examination. Limited examinations for reoccurring indications may be performed as noted. The reflux portion of the exam is performed with the patient in reverse Trendelenburg.  +---------+---------------+---------+-----------+----------+--------------+ RIGHT    CompressibilityPhasicitySpontaneityPropertiesThrombus Aging +---------+---------------+---------+-----------+----------+--------------+ CFV      Full           Yes      Yes                                 +---------+---------------+---------+-----------+----------+--------------+ SFJ      Full                                                         +---------+---------------+---------+-----------+----------+--------------+ FV Prox  Full                                                        +---------+---------------+---------+-----------+----------+--------------+ FV Mid   Full                                                        +---------+---------------+---------+-----------+----------+--------------+ FV DistalFull                                                        +---------+---------------+---------+-----------+----------+--------------+ PFV      Full                                                        +---------+---------------+---------+-----------+----------+--------------+ POP      Full           Yes      Yes                                 +---------+---------------+---------+-----------+----------+--------------+  PTV      Full                                                        +---------+---------------+---------+-----------+----------+--------------+ PERO     Full                                                        +---------+---------------+---------+-----------+----------+--------------+   +---------+---------------+---------+-----------+----------+--------------+ LEFT     CompressibilityPhasicitySpontaneityPropertiesThrombus Aging +---------+---------------+---------+-----------+----------+--------------+ CFV      Full           Yes      Yes                                 +---------+---------------+---------+-----------+----------+--------------+ SFJ      Full                                                        +---------+---------------+---------+-----------+----------+--------------+ FV Prox  Full                                                        +---------+---------------+---------+-----------+----------+--------------+ FV Mid   Full                                                         +---------+---------------+---------+-----------+----------+--------------+ FV DistalFull                                                        +---------+---------------+---------+-----------+----------+--------------+ PFV      Full                                                        +---------+---------------+---------+-----------+----------+--------------+ POP      Full           Yes      Yes                                 +---------+---------------+---------+-----------+----------+--------------+ PTV      Full                                                        +---------+---------------+---------+-----------+----------+--------------+  PERO     Full                                                        +---------+---------------+---------+-----------+----------+--------------+     Summary: RIGHT: - There is no evidence of deep vein thrombosis in the lower extremity.  - No cystic structure found in the popliteal fossa.  LEFT: - There is no evidence of deep vein thrombosis in the lower extremity.  - No cystic structure found in the popliteal fossa.  *See table(s) above for measurements and observations. Electronically signed by Waverly Ferrari MD on 05/18/2021 at 5:23:14 PM.    Final         Scheduled Meds:  vitamin C  500 mg Oral Daily   baricitinib  4 mg Oral Daily   Chlorhexidine Gluconate Cloth  6 each Topical Daily   enoxaparin (LOVENOX) injection  0.5 mg/kg Subcutaneous Q24H   guaiFENesin  1,200 mg Oral BID   Ipratropium-Albuterol  1 puff Inhalation Q6H   mouth rinse  15 mL Mouth Rinse BID   predniSONE  50 mg Oral Daily   zinc sulfate  220 mg Oral Daily   Continuous Infusions:   LOS: 6 days    Time spent: over 30 min    Lacretia Nicks, MD [facility]   To contact the attending provider between 7A-7P or the covering provider during after hours 7P-7A, please log into the web site [email or web address] and access using universal [facility] password for that web site. If you do not have the password, please call the hospital operator.  05/20/2021, 8:53 AM

## 2021-05-21 ENCOUNTER — Inpatient Hospital Stay (HOSPITAL_COMMUNITY): Payer: Medicare Other

## 2021-05-21 DIAGNOSIS — U071 COVID-19: Secondary | ICD-10-CM | POA: Diagnosis not present

## 2021-05-21 DIAGNOSIS — J1282 Pneumonia due to coronavirus disease 2019: Secondary | ICD-10-CM | POA: Diagnosis not present

## 2021-05-21 LAB — GLUCOSE, CAPILLARY
Glucose-Capillary: 149 mg/dL — ABNORMAL HIGH (ref 70–99)
Glucose-Capillary: 226 mg/dL — ABNORMAL HIGH (ref 70–99)
Glucose-Capillary: 147 mg/dL — ABNORMAL HIGH (ref 70–99)
Glucose-Capillary: 128 mg/dL — ABNORMAL HIGH (ref 70–99)
Glucose-Capillary: 142 mg/dL — ABNORMAL HIGH (ref 70–99)

## 2021-05-21 LAB — CBC WITH DIFFERENTIAL/PLATELET
Abs Immature Granulocytes: 0.08 10*3/uL — ABNORMAL HIGH (ref 0.00–0.07)
Basophils Absolute: 0 10*3/uL (ref 0.0–0.1)
Basophils Relative: 0 %
Eosinophils Absolute: 0 10*3/uL (ref 0.0–0.5)
Eosinophils Relative: 0 %
HCT: 40.9 % (ref 39.0–52.0)
Hemoglobin: 14.1 g/dL (ref 13.0–17.0)
Immature Granulocytes: 1 %
Lymphocytes Relative: 4 %
Lymphs Abs: 0.3 10*3/uL — ABNORMAL LOW (ref 0.7–4.0)
MCH: 32.2 pg (ref 26.0–34.0)
MCHC: 34.5 g/dL (ref 30.0–36.0)
MCV: 93.4 fL (ref 80.0–100.0)
Monocytes Absolute: 0.2 10*3/uL (ref 0.1–1.0)
Monocytes Relative: 2 %
Neutro Abs: 9.1 10*3/uL — ABNORMAL HIGH (ref 1.7–7.7)
Neutrophils Relative %: 93 %
Platelets: 240 10*3/uL (ref 150–400)
RBC: 4.38 MIL/uL (ref 4.22–5.81)
RDW: 12.6 % (ref 11.5–15.5)
WBC: 9.6 10*3/uL (ref 4.0–10.5)
nRBC: 0 % (ref 0.0–0.2)

## 2021-05-21 LAB — COMPREHENSIVE METABOLIC PANEL
ALT: 54 U/L — ABNORMAL HIGH (ref 0–44)
AST: 33 U/L (ref 15–41)
Albumin: 2.7 g/dL — ABNORMAL LOW (ref 3.5–5.0)
Alkaline Phosphatase: 52 U/L (ref 38–126)
Anion gap: 7 (ref 5–15)
BUN: 26 mg/dL — ABNORMAL HIGH (ref 8–23)
CO2: 26 mmol/L (ref 22–32)
Calcium: 8.1 mg/dL — ABNORMAL LOW (ref 8.9–10.3)
Chloride: 99 mmol/L (ref 98–111)
Creatinine, Ser: 0.76 mg/dL (ref 0.61–1.24)
GFR, Estimated: >60 mL/min (ref >60)
Glucose, Bld: 186 mg/dL — ABNORMAL HIGH (ref 70–99)
Potassium: 4.4 mmol/L (ref 3.5–5.1)
Sodium: 132 mmol/L — ABNORMAL LOW (ref 135–145)
Total Bilirubin: 0.8 mg/dL (ref 0.3–1.2)
Total Protein: 6.4 g/dL — ABNORMAL LOW (ref 6.5–8.1)

## 2021-05-21 LAB — MAGNESIUM: Magnesium: 2.5 mg/dL — ABNORMAL HIGH (ref 1.7–2.4)

## 2021-05-21 LAB — HEMOGLOBIN A1C
Hgb A1c MFr Bld: 6.5 % — ABNORMAL HIGH (ref 4.8–5.6)
Mean Plasma Glucose: 139.85 mg/dL

## 2021-05-21 LAB — HEPATITIS PANEL, ACUTE
HCV Ab: NONREACTIVE
Hep A IgM: NONREACTIVE
Hep B C IgM: NONREACTIVE
Hepatitis B Surface Ag: NONREACTIVE

## 2021-05-21 LAB — PHOSPHORUS: Phosphorus: 3.2 mg/dL (ref 2.5–4.6)

## 2021-05-21 LAB — D-DIMER, QUANTITATIVE: D-Dimer, Quant: 3.71 ug/mL-FEU — ABNORMAL HIGH (ref 0.00–0.50)

## 2021-05-21 LAB — C-REACTIVE PROTEIN: CRP: 6.8 mg/dL — ABNORMAL HIGH (ref <1.0)

## 2021-05-21 MED ORDER — INSULIN DETEMIR 100 UNIT/ML ~~LOC~~ SOLN
5.0000 [IU] | Freq: Two times a day (BID) | SUBCUTANEOUS | Status: DC
  Administered 2021-05-21 – 2021-05-27 (×12): 5 [IU] via SUBCUTANEOUS
  Filled 2021-05-21 (×15): qty 0.05

## 2021-05-21 MED ORDER — LINAGLIPTIN 5 MG PO TABS
5.0000 mg | ORAL_TABLET | Freq: Every day | ORAL | Status: DC
  Administered 2021-05-21 – 2021-05-27 (×7): 5 mg via ORAL
  Filled 2021-05-21 (×6): qty 1

## 2021-05-21 MED ORDER — INSULIN ASPART 100 UNIT/ML IJ SOLN
0.0000 [IU] | Freq: Three times a day (TID) | INTRAMUSCULAR | Status: DC
  Administered 2021-05-21: 3 [IU] via SUBCUTANEOUS
  Administered 2021-05-21 – 2021-05-22 (×3): 1 [IU] via SUBCUTANEOUS
  Administered 2021-05-22: 2 [IU] via SUBCUTANEOUS
  Administered 2021-05-23 – 2021-05-25 (×4): 1 [IU] via SUBCUTANEOUS
  Administered 2021-05-26: 2 [IU] via SUBCUTANEOUS

## 2021-05-21 NOTE — Plan of Care (Signed)

## 2021-05-21 NOTE — Progress Notes (Signed)
Inpatient Diabetes Program Recommendations  AACE/ADA: New Consensus Statement on Inpatient Glycemic Control (2015)  Target Ranges:  Prepandial:   less than 140 mg/dL      Peak postprandial:   less than 180 mg/dL (1-2 hours)      Critically ill patients:  140 - 180 mg/dL   Lab Results  Component Value Date   GLUCAP 226 (H) 05/21/2021   HGBA1C 6.5 (H) 05/21/2021    Review of Glycemic Control Results for JEREMIAH, CURCI (MRN [id number]) as of 05/21/2021 12:22  Ref. Range 05/21/2021 08:03 05/21/2021 11:14  Glucose-Capillary Latest Ref Range: 70 - 99 mg/dL 235 (H) 573 (H)   Diabetes history: New diagnosis Current orders for Inpatient glycemic control:  Tradjenta 5 mg daily Levemir 5 units bid Novolog 0-9 units tid  Solumedrol 60 mg Q12 hours  Inpatient Diabetes Program Recommendations:    -  Consider adding Novolog 3 units tid meal coverage if eating >50% of meals  Spoke with patient about new diabetes diagnosis.  Pt reports having a great nephew that had diabetes and he learned how to care for him and watched him grow up with it.  Discussed A1C results (6.5%) and explained what an A1C is. Discussed basic pathophysiology of DM Type 2, basic home care, importance of checking CBGs and maintaining good CBG control to prevent long-term and short-term complications. Reviewed glucose and A1C goals. Mentioned to pt he may have to be on medication a short time while on steroids.  Reviewed signs and symptoms of hyperglycemia and hypoglycemia along with treatment for both. Discussed impact of nutrition, exercise, stress, sickness, and medications on diabetes control. Pt is familiar with checking glucose at home.   Thanks, Christena Deem RN, MSN, BC-ADM Inpatient Diabetes Coordinator Team Pager [phone] (8a-5p)

## 2021-05-21 NOTE — Evaluation (Signed)
Physical Therapy Evaluation Patient Details Name: Thomas Hammond MRN: [id number] DOB: 09/15/1951 Today's Date: 05/21/2021   History of Present Illness  70 yo male admitted with COVID. Hx of asthma  Clinical Impression  On eval, pt required Min A for mobility. He walked ~20 feet around the room without a device. He is unsteady. O2 84% on 15L HFNC with ambulation, 92% at rest. Will plan to follow and progress activity as tolerated.     Follow Up Recommendations Home health PT    Equipment Recommendations  None recommended by PT    Recommendations for Other Services       Precautions / Restrictions Precautions Precautions: Fall Precaution Comments: monitor O2; currently on HFNC Restrictions Weight Bearing Restrictions: No      Mobility  Bed Mobility               General bed mobility comments: oob in recliner    Transfers Overall transfer level: Needs assistance   Transfers: Sit to/from Stand Sit to Stand: Supervision         General transfer comment: supv for safety  Ambulation/Gait Ambulation/Gait assistance: Min assist Gait Distance (Feet): 20 Feet (7'x1; 20'x1) Assistive device: None Gait Pattern/deviations: Step-through pattern;Decreased stride length     General Gait Details: Wobbly/Unsteady. O2 84% on 15L, dyspnea 2/4.  Stairs            Wheelchair Mobility    Modified Rankin (Stroke Patients Only)       Balance Overall balance assessment: Needs assistance         Standing balance support: During functional activity Standing balance-Leahy Scale: Fair                               Pertinent Vitals/Pain Pain Assessment: Faces Faces Pain Scale: Hurts a little bit Pain Location: finger on R hand Pain Descriptors / Indicators: Discomfort;Sore Pain Intervention(s): Monitored during session    Home Living Family/patient expects to be discharged to:: Private residence Living Arrangements: Alone   Type of Home:  House Home Access: Stairs to enter   Secretary/administrator of Steps: 2 Home Layout: One level Home Equipment: None      Prior Function Level of Independence: Independent               Hand Dominance        Extremity/Trunk Assessment   Upper Extremity Assessment Upper Extremity Assessment: Overall WFL for tasks assessed    Lower Extremity Assessment Lower Extremity Assessment: Overall WFL for tasks assessed    Cervical / Trunk Assessment Cervical / Trunk Assessment: Normal  Communication   Communication: No difficulties  Cognition Arousal/Alertness: Awake/alert Behavior During Therapy: WFL for tasks assessed/performed Overall Cognitive Status: Within Functional Limits for tasks assessed                                        General Comments      Exercises     Assessment/Plan    PT Assessment Patient needs continued PT services  PT Problem List Decreased mobility;Decreased activity tolerance;Decreased balance;Cardiopulmonary status limiting activity       PT Treatment Interventions Gait training;Therapeutic exercise;Balance training;Functional mobility training;Therapeutic activities;Patient/family education    PT Goals (Current goals can be found in the Care Plan section)  Acute Rehab PT Goals Patient Stated Goal: regain PLOF PT Goal Formulation: With  patient Time For Goal Achievement: 06/04/21 Potential to Achieve Goals: Good    Frequency Min 3X/week   Barriers to discharge        Co-evaluation               AM-PAC PT "6 Clicks" Mobility  Outcome Measure Help needed turning from your back to your side while in a flat bed without using bedrails?: A Little Help needed moving from lying on your back to sitting on the side of a flat bed without using bedrails?: A Little Help needed moving to and from a bed to a chair (including a wheelchair)?: A Little Help needed standing up from a chair using your arms (e.g., wheelchair  or bedside chair)?: A Little Help needed to walk in hospital room?: A Little Help needed climbing 3-5 steps with a railing? : A Little 6 Click Score: 18    End of Session   Activity Tolerance: Patient tolerated treatment well Patient left: in chair;with call bell/phone within reach   PT Visit Diagnosis: Unsteadiness on feet (R26.81);Difficulty in walking, not elsewhere classified (R26.2)    Time: 1042-1100 PT Time Calculation (min) (ACUTE ONLY): 18 min   Charges:   PT Evaluation $PT Eval Moderate Complexity: 1 Mod           Faye Ramsay, PT Acute Rehabilitation  Office: [phone] Pager: [phone]

## 2021-05-21 NOTE — Progress Notes (Addendum)
PROGRESS NOTE    Thomas Hammond  [id number]:[id number] DOB: [birth year] DOA: 05/13/2021 PCP: Pcp, No   Chief Complaint  Patient presents with   Shortness of Breath   Brief Narrative:  70 year old male with history of mild intermittent asthma who was recently vaccinated against COVID-19, presented to the ED with ongoing dyspnea for 5 weeks, worse for past few days.  He was found to be in hypoxemic respiratory failure requiring 10 L/min of oxygen.  Patient was admitted with acute hypoxemic respiratory failure due to COVID-19 pneumonia.  Assessment & Plan:   Active Problems:   Pneumonia due to COVID-19 virus    Acute Hypoxic Respiratory Failure  COVID 19 Pneumonia  Currently on NRB + 10 L HFNC CXR 7/28 with bilateral peripheral predominant heterogenous opacities CXR 8/1 with stable bilateral airspace disease L>R Continue steroids (7/24-present - will need taper after 10 days), baricitinib (day 7 out of 14) Completed course of remdesivir Lasix as tolerated for goal net negative/even (hold with hypotension 7/31 which improved with IVF) Strict I/O, daily weights Procal 7/25 0.63, borderline - suspect presentation is related to covid and less likely bacterial infection, continue to hold abx Daily inflammatory markers - CRP up today, follow with steroids OOB, IS, flutter, therapy   COVID-19 Labs  Recent Labs    05/19/21 0243 05/21/21 0224  DDIMER 2.54* 3.71*  CRP 1.2* 6.8*    Lab Results  Component Value Date   SARSCOV2NAA POSITIVE (Thomas Hammond) 05/09/2021   Elevated D dimer 2/2 covid 19 infection - CT PE protocol on 7/24 negative for PE and LE [place] on 7/29 negative for DVT  Hypotension Hypotensive 7/31 after lasix - improved with bolus Hold additional lasix for now  T2DM New diagnosis.   Basal, SSI Add tradjenta given benefit with covid infection Likely can d/c with metformin   Elevated LFT's  Hx Cholecystectomy Likely 2/2 covid 19 infection Follow acute hepatitis panel, reports hx  of  cholecystectomy  History of asthma No wheezing, continue steroids and inhalers  DVT prophylaxis: lovenox Code Status: partial  Family Communication: none at bedside Disposition:   Status is: Inpatient  Remains inpatient appropriate because:Inpatient level of care appropriate due to severity of illness  Dispo: The patient is from: Home              Anticipated d/c is to: Home              Patient currently is not medically stable to d/c.   Difficult to place patient No       Consultants:  LE [place] Summary:  RIGHT:  - There is no evidence of deep vein thrombosis in the lower extremity.     - No cystic structure found in the popliteal fossa.     LEFT:  - There is no evidence of deep vein thrombosis in the lower extremity.     - No cystic structure found in the popliteal fossa.      Procedures:   Antimicrobials: Anti-infectives (From admission, onward)    Start     Dose/Rate Route Frequency Ordered Stop   05/18/21 1000  remdesivir 100 mg in sodium chloride 0.9 % 100 mL IVPB        100 mg 200 mL/hr over 30 Minutes Intravenous Daily 05/17/21 1203 05/18/21 1042   05/15/21 1000  remdesivir 100 mg in sodium chloride 0.9 % 100 mL IVPB  Status:  Discontinued        100 mg 200 mL/hr over 30 Minutes Intravenous Daily  05/13/21 2353 05/17/21 1204   05/14/21 0000  remdesivir 100 mg in sodium chloride 0.9 % 100 mL IVPB        100 mg 200 mL/hr over 30 Minutes Intravenous Every 30 min 05/13/21 2353 05/14/21 0039          Subjective: No new complaints  Objective: Vitals:   05/21/21 0600 05/21/21 0700 05/21/21 0800 05/21/21 0829  BP: 120/69  119/66   Pulse: (!) 59 (!) 57 (!) 55   Resp: 17 17 (!) 23   Temp:   97.9 F (36.6 C)   TempSrc:   Axillary   SpO2: 92% 90% (!) 88% 93%  Weight:      Height:        Intake/Output Summary (Last 24 hours) at 05/21/2021 0928 Last data filed at 05/21/2021 0800 Gross per 24 hour  Intake 980 ml  Output 1975 ml  Net -995 ml    Filed Weights   05/13/21 2132 05/21/21 0223  Weight: 110.2 kg 99.3 kg    Examination:  General: No acute distress. Sitting up in chair. Cardiovascular: Heart sounds show Thomas Hammond regular rate, and rhythm.  Lungs: bibasilar crackles with isolation scope Abdomen: Soft, nontender, nondistended  Neurological: Alert and oriented 3. Moves all extremities 4 . Cranial nerves II through XII grossly intact. Skin: Warm and dry. No rashes or lesions. Extremities: No clubbing or cyanosis. No edema.     Data Reviewed: I have personally reviewed following labs and imaging studies  CBC: Recent Labs  Lab 05/16/21 0240 05/17/21 0301 05/18/21 0251 05/19/21 0243 05/21/21 0224  WBC 9.4 8.0 8.8 7.6 9.6  NEUTROABS 8.4* 7.1 7.6 6.4 9.1*  HGB 15.3 14.1 14.5 14.7 14.1  HCT 44.6 41.3 42.7 42.9 40.9  MCV 94.3 94.1 96.0 95.3 93.4  PLT 303 257 243 260 240    Basic Metabolic Panel: Recent Labs  Lab 05/15/21 0242 05/16/21 0240 05/17/21 0301 05/18/21 0251 05/19/21 0243 05/20/21 0233 05/21/21 0224  NA 136   < > 139 138 138 134* 132*  K 4.0   < > 4.4 4.5 4.3 4.5 4.4  CL 101   < > 103 102 101 97* 99  CO2 22   < > 26 28 28 31 26   GLUCOSE 159*   < > 163* 143* 101* 97 186*  BUN 39*   < > 37* 34* 30* 21 26*  CREATININE 0.78   < > 0.88 0.84 0.77 0.88 0.76  CALCIUM 8.3*   < > 8.3* 8.1* 8.1* 8.2* 8.1*  MG 2.6*  --   --   --   --  2.4 2.5*  PHOS 4.2  --   --   --   --   --  3.2   < > = values in this interval not displayed.    GFR: Estimated Creatinine Clearance: 96.1 mL/min (by C-G formula based on SCr of 0.76 mg/dL).  Liver Function Tests: Recent Labs  Lab 05/16/21 0240 05/17/21 0301 05/18/21 0251 05/19/21 0243 05/21/21 0224  AST 48* 70* 77* 76* 33  ALT 34 50* 67* 87* 54*  ALKPHOS 59 57 56 54 52  BILITOT 0.7 0.9 1.0 1.0 0.8  PROT 7.5 6.9 6.6 6.6 6.4*  ALBUMIN 2.9* 2.7* 2.8* 2.8* 2.7*    CBG: Recent Labs  Lab 05/21/21 0803  GLUCAP 149*     Recent Results (from the past 240  hour(s))  MRSA Next Gen by PCR, Nasal     Status: None   Collection Time:  05/14/21  4:06 PM   Specimen: Nasal Mucosa; Nasal Swab  Result Value Ref Range Status   MRSA by PCR Next Gen NOT DETECTED NOT DETECTED Final    Comment: (NOTE) The GeneXpert MRSA Assay (FDA approved for NASAL specimens only), is one component of [facility] comprehensive MRSA colonization surveillance program. It is not intended to diagnose MRSA infection nor to guide or monitor treatment for MRSA infections. Test performance is not FDA approved in patients less than 13 years old. Performed at [facility], [address], [place], [place] [address]          Radiology Studies: DG CHEST PORT 1 VIEW  Result Date: 05/21/2021 CLINICAL DATA:  Shortness of breath, hypoxemia EXAM: PORTABLE CHEST 1 VIEW COMPARISON:  05/17/2021 FINDINGS: Bilateral airspace disease again noted, left greater than right. Heart is borderline in size. No visible effusions or pneumothorax. No acute bony abnormality. IMPRESSION: Stable bilateral airspace disease, left greater than right. Electronically Signed   By: Charlett Nose M.D.   On: 05/21/2021 08:39        Scheduled Meds:  vitamin C  500 mg Oral Daily   baricitinib  4 mg Oral Daily   Chlorhexidine Gluconate Cloth  6 each Topical Daily   enoxaparin (LOVENOX) injection  0.5 mg/kg Subcutaneous Q24H   guaiFENesin  1,200 mg Oral BID   insulin aspart  0-9 Units Subcutaneous TID WC   insulin detemir  5 Units Subcutaneous BID   Ipratropium-Albuterol  1 puff Inhalation Q6H   mouth rinse  15 mL Mouth Rinse BID   methylPREDNISolone (SOLU-MEDROL) injection  60 mg Intravenous Q12H   Followed by   Melene Muller ON 05/23/2021] predniSONE  60 mg Oral Q breakfast   Followed by   Melene Muller ON 05/25/2021] predniSONE  40 mg Oral Q breakfast   Followed by   Melene Muller ON 05/27/2021] predniSONE  30 mg Oral Q breakfast   Followed by   Melene Muller ON 05/29/2021] predniSONE  20 mg Oral Q breakfast   Followed by    Melene Muller ON 05/31/2021] predniSONE  10 mg Oral Q breakfast   zinc sulfate  220 mg Oral Daily   Continuous Infusions:   LOS: 7 days    Time spent: over 30 min    Lacretia Nicks, MD [facility]   To contact the attending provider between 7A-7P or the covering provider during after hours 7P-7A, please log into the web site [email or web address] and access using universal [place] password for that web site. If you do not have the password, please call the hospital operator.  05/21/2021, 9:28 AM

## 2021-05-21 NOTE — TOC Progression Note (Signed)
Transition of Care [facility]) - Progression Note    Patient Details  Name: Thomas Hammond MRN: [id number] Date of Birth: Sep 09, 1951  [facility] CM/SW Contact  Golda Acre, RN Phone Number: 05/21/2021, 8:44 AM  Clinical Narrative:    70 year old male with history of mild intermittent asthma who was recently vaccinated against COVID-19, presented to the ED with ongoing dyspnea for 5 weeks, worse for past few days.  He was found to be in hypoxemic respiratory failure requiring 10 L/min of oxygen.  Patient was admitted with acute hypoxemic respiratory failure due to COVID-19 pneumonia.   Assessment & Plan:   Active Problems:   Pneumonia due to COVID-19 virus   Addendum Hypotension More hypotensive today, received lasix - follow with small bolus.  Seems to be asymptomatic.   Acute Hypoxic Respiratory Failure  COVID 19 Pneumonia Currently on NRB + 10 L HFNC CXR 7/28 with bilateral peripheral predominant heterogenous opacities Continue steroids (7/24-present - will need taper after 10 days), baricitinib (day 7 out of 14) Completed course of remdesivir Lasix as tolerated for goal net negative/even Strict I/O, daily weights Procal 7/25 0.63, borderline - suspect presentation is related to covid and less likely bacterial infection, continue to hold abx Daily inflammatory markers OOB, IS, flutter, therapy    TOC PLAN OF CARE: To return home when resp sttus improved, still requiring 15l hfnc.   Expected Discharge Plan: Home/Self Care Barriers to Discharge: Continued Medical Work up  Expected Discharge Plan and Services Expected Discharge Plan: Home/Self Care   Discharge Planning Services: CM Consult   Living arrangements for the past 2 months: Single Family Home                                       Social Determinants of Health (SDOH) Interventions    Readmission Risk Interventions No flowsheet data found.

## 2021-05-22 DIAGNOSIS — U071 COVID-19: Secondary | ICD-10-CM | POA: Diagnosis not present

## 2021-05-22 DIAGNOSIS — J1282 Pneumonia due to coronavirus disease 2019: Secondary | ICD-10-CM | POA: Diagnosis not present

## 2021-05-22 LAB — COMPREHENSIVE METABOLIC PANEL
ALT: 54 U/L — ABNORMAL HIGH (ref 0–44)
AST: 34 U/L (ref 15–41)
Albumin: 2.5 g/dL — ABNORMAL LOW (ref 3.5–5.0)
Alkaline Phosphatase: 55 U/L (ref 38–126)
Anion gap: 9 (ref 5–15)
BUN: 29 mg/dL — ABNORMAL HIGH (ref 8–23)
CO2: 26 mmol/L (ref 22–32)
Calcium: 8.4 mg/dL — ABNORMAL LOW (ref 8.9–10.3)
Chloride: 100 mmol/L (ref 98–111)
Creatinine, Ser: 0.8 mg/dL (ref 0.61–1.24)
GFR, Estimated: >60 mL/min (ref >60)
Glucose, Bld: 147 mg/dL — ABNORMAL HIGH (ref 70–99)
Potassium: 4.8 mmol/L (ref 3.5–5.1)
Sodium: 135 mmol/L (ref 135–145)
Total Bilirubin: 1 mg/dL (ref 0.3–1.2)
Total Protein: 6.1 g/dL — ABNORMAL LOW (ref 6.5–8.1)

## 2021-05-22 LAB — CBC WITH DIFFERENTIAL/PLATELET
Abs Immature Granulocytes: 0.17 10*3/uL — ABNORMAL HIGH (ref 0.00–0.07)
Basophils Absolute: 0 10*3/uL (ref 0.0–0.1)
Basophils Relative: 0 %
Eosinophils Absolute: 0 10*3/uL (ref 0.0–0.5)
Eosinophils Relative: 0 %
HCT: 41.6 % (ref 39.0–52.0)
Hemoglobin: 14.3 g/dL (ref 13.0–17.0)
Immature Granulocytes: 1 %
Lymphocytes Relative: 4 %
Lymphs Abs: 0.5 10*3/uL — ABNORMAL LOW (ref 0.7–4.0)
MCH: 32.2 pg (ref 26.0–34.0)
MCHC: 34.4 g/dL (ref 30.0–36.0)
MCV: 93.7 fL (ref 80.0–100.0)
Monocytes Absolute: 0.6 10*3/uL (ref 0.1–1.0)
Monocytes Relative: 4 %
Neutro Abs: 12.5 10*3/uL — ABNORMAL HIGH (ref 1.7–7.7)
Neutrophils Relative %: 91 %
Platelets: 276 10*3/uL (ref 150–400)
RBC: 4.44 MIL/uL (ref 4.22–5.81)
RDW: 12.9 % (ref 11.5–15.5)
WBC: 13.7 10*3/uL — ABNORMAL HIGH (ref 4.0–10.5)
nRBC: 0 % (ref 0.0–0.2)

## 2021-05-22 LAB — GLUCOSE, CAPILLARY
Glucose-Capillary: 131 mg/dL — ABNORMAL HIGH (ref 70–99)
Glucose-Capillary: 197 mg/dL — ABNORMAL HIGH (ref 70–99)
Glucose-Capillary: 115 mg/dL — ABNORMAL HIGH (ref 70–99)
Glucose-Capillary: 122 mg/dL — ABNORMAL HIGH (ref 70–99)

## 2021-05-22 LAB — C-REACTIVE PROTEIN: CRP: 1.5 mg/dL — ABNORMAL HIGH (ref <1.0)

## 2021-05-22 LAB — D-DIMER, QUANTITATIVE: D-Dimer, Quant: 2.81 ug/mL-FEU — ABNORMAL HIGH (ref 0.00–0.50)

## 2021-05-22 LAB — MAGNESIUM: Magnesium: 2.5 mg/dL — ABNORMAL HIGH (ref 1.7–2.4)

## 2021-05-22 LAB — PHOSPHORUS: Phosphorus: 4.1 mg/dL (ref 2.5–4.6)

## 2021-05-22 MED ORDER — PANTOPRAZOLE SODIUM 40 MG PO TBEC
40.0000 mg | DELAYED_RELEASE_TABLET | Freq: Every day | ORAL | Status: DC
  Administered 2021-05-22 – 2021-05-27 (×6): 40 mg via ORAL
  Filled 2021-05-22 (×6): qty 1

## 2021-05-22 NOTE — Progress Notes (Signed)
Physical Therapy Treatment Patient Details Name: Thomas Hammond MRN: [id number] DOB: [birth year] Today's Date: 05/22/2021    History of Present Illness 70 yo male admitted with COVID. Hx of asthma    PT Comments    Pt continues to participate well. O2 88% on 15L with activity during session on today. Encouraged pt to spend as much time OOB as he could tolerate. Will continue to follow.    Follow Up Recommendations  Home health PT     Equipment Recommendations  None recommended by PT    Recommendations for Other Services       Precautions / Restrictions Precautions Precautions: Fall Precaution Comments: monitor O2; currently on HFNC Restrictions Weight Bearing Restrictions: No    Mobility  Bed Mobility Overal bed mobility: Modified Independent                  Transfers Overall transfer level: Needs assistance   Transfers: Sit to/from Stand Sit to Stand: Supervision         General transfer comment: Close Supv for safety. LOB x 1  Ambulation/Gait Ambulation/Gait assistance: Min guard Gait Distance (Feet): 15 Feet Assistive device: None Gait Pattern/deviations: Step-through pattern;Decreased stride length     General Gait Details: Wobbly/Unsteady. O2 88% on 15L, dyspnea 2/4. No O2 tank available on floor to ambulate laps around room.    Stairs             Wheelchair Mobility    Modified Rankin (Stroke Patients Only)       Balance                                            Cognition Arousal/Alertness: Awake/alert Behavior During Therapy: WFL for tasks assessed/performed Overall Cognitive Status: Within Functional Limits for tasks assessed                                        Exercises Other Exercises Other Exercises: Sit to stand xd 10 reps. Marching x 15 reps    General Comments        Pertinent Vitals/Pain Pain Assessment: No/denies pain    Home Living                       Prior Function            PT Goals (current goals can now be found in the care plan section) Progress towards PT goals: Progressing toward goals    Frequency    Min 3X/week      PT Plan Current plan remains appropriate    Co-evaluation              AM-PAC PT "6 Clicks" Mobility   Outcome Measure  Help needed turning from your back to your side while in a flat bed without using bedrails?: A Little Help needed moving from lying on your back to sitting on the side of a flat bed without using bedrails?: A Little Help needed moving to and from a bed to a chair (including a wheelchair)?: A Little Help needed standing up from a chair using your arms (e.g., wheelchair or bedside chair)?: A Little Help needed to walk in hospital room?: A Little Help needed climbing 3-5 steps with a railing? : A Little 6 Click  Score: 18    End of Session Equipment Utilized During Treatment: Oxygen Activity Tolerance: Patient tolerated treatment well Patient left: in chair;with call bell/phone within reach   PT Visit Diagnosis: Unsteadiness on feet (R26.81);Difficulty in walking, not elsewhere classified (R26.2)     Time: 1030-1058 PT Time Calculation (min) (ACUTE ONLY): 28 min  Charges:  $Gait Training: 8-22 mins $Therapeutic Exercise: 8-22 mins                         Faye Ramsay, PT Acute Rehabilitation  Office: [phone] Pager: [phone]

## 2021-05-22 NOTE — Progress Notes (Signed)
PROGRESS NOTE    Thomas Hammond  AVW:[id number] DOB: August 18, 1951 DOA: 05/13/2021 PCP: Pcp, No   Chief Complaint  Patient presents with   Shortness of Breath   Brief Narrative:  70 year old male with history of mild intermittent asthma who was recently vaccinated against COVID-19, presented to the ED with ongoing dyspnea for 5 weeks, worse for past few days.  He was found to be in hypoxemic respiratory failure requiring 10 L/min of oxygen.  Patient was admitted with acute hypoxemic respiratory failure due to COVID-19 pneumonia.  He's currently stable, but critically ill requiring significant supplemental oxygen over the past several days.  Being treated with steroids and baricitinib, completed remdesivir.  Assessment & Plan:   Active Problems:   Pneumonia due to COVID-19 virus    Acute Hypoxic Respiratory Failure  COVID 19 Pneumonia  Currently requiring 15 L HFNC (wean for sat >88%) CXR 7/28 with bilateral peripheral predominant heterogenous opacities CXR 8/1 with stable bilateral airspace disease L>R Continue steroids (7/24- present - will need taper after 10 days), baricitinib (day 9 out of 14) Completed course of remdesivir Lasix as tolerated for goal net negative/even (hold with hypotension 7/31 which improved with IVF) Strict I/O (net negative 2.3 L), daily weights Procal 7/25 0.63, borderline - suspect presentation is related to covid and less likely bacterial infection, continue to hold abx Daily inflammatory markers - CRP and d dimer fluctuating, follow with steroids/baricitinib OOB, IS, flutter, therapy   COVID-19 Labs  Recent Labs    05/21/21 0224 05/22/21 0349  DDIMER 3.71* 2.81*  CRP 6.8* 1.5*    Lab Results  Component Value Date   SARSCOV2NAA POSITIVE (Dalana Pfahler) 05/09/2021   Elevated D dimer 2/2 covid 19 infection - CT PE protocol on 7/24 negative for PE and LE [place] on 7/29 negative for DVT  Hypotension Hypotensive 7/31 after lasix - improved with bolus Hold  additional lasix for now  Leukocytosis Likely 2/2 steroids, follow fever curve and clinical status - would have low threshold for abx if clinically declining   T2DM New diagnosis.   Basal, SSI Add tradjenta given benefit with covid infection Likely can d/c with metformin   Elevated LFT's  Hx Cholecystectomy Likely 2/2 covid 19 infection Follow acute hepatitis panel (negative), reports hx of  cholecystectomy  History of asthma No wheezing, continue steroids and inhalers  DVT prophylaxis: lovenox Code Status: partial  Family Communication: none at bedside Disposition:   Status is: Inpatient  Remains inpatient appropriate because:Inpatient level of care appropriate due to severity of illness  Dispo: The patient is from: Home              Anticipated d/c is to: Home              Patient currently is not medically stable to d/c.   Difficult to place patient No       Consultants:  LE [place] Summary:  RIGHT:  - There is no evidence of deep vein thrombosis in the lower extremity.     - No cystic structure found in the popliteal fossa.     LEFT:  - There is no evidence of deep vein thrombosis in the lower extremity.     - No cystic structure found in the popliteal fossa.      Procedures:   Antimicrobials: Anti-infectives (From admission, onward)    Start     Dose/Rate Route Frequency Ordered Stop   05/18/21 1000  remdesivir 100 mg in sodium chloride 0.9 % 100 mL  IVPB        100 mg 200 mL/hr over 30 Minutes Intravenous Daily 05/17/21 1203 05/18/21 1042   05/15/21 1000  remdesivir 100 mg in sodium chloride 0.9 % 100 mL IVPB  Status:  Discontinued        100 mg 200 mL/hr over 30 Minutes Intravenous Daily 05/13/21 2353 05/17/21 1204   05/14/21 0000  remdesivir 100 mg in sodium chloride 0.9 % 100 mL IVPB        100 mg 200 mL/hr over 30 Minutes Intravenous Every 30 min 05/13/21 2353 05/14/21 0039          Subjective: No new complaints  Objective: Vitals:    05/21/21 2211 05/22/21 0217 05/22/21 0500 05/22/21 0554  BP: 109/70 108/68  (!) 117/54  Pulse: 65 61  64  Resp: (!) 24 (!) 21  16  Temp: 98 F (36.7 C) 97.7 F (36.5 C)  97.7 F (36.5 C)  TempSrc: Oral Oral  Oral  SpO2: 95% 94%  94%  Weight:   99.4 kg   Height:        Intake/Output Summary (Last 24 hours) at 05/22/2021 1005 Last data filed at 05/22/2021 0604 Gross per 24 hour  Intake 240 ml  Output 775 ml  Net -535 ml   Filed Weights   05/13/21 2132 05/21/21 0223 05/22/21 0500  Weight: 110.2 kg 99.3 kg 99.4 kg    Examination:  General: No acute distress. Cardiovascular: RRR Lungs: unlabored Abdomen: Soft, nontender, nondistended  Neurological: Alert and oriented 3. Moves all extremities 4 . Cranial nerves II through XII grossly intact. Skin: Warm and dry. No rashes or lesions. Extremities: No clubbing or cyanosis. No edema.     Data Reviewed: I have personally reviewed following labs and imaging studies  CBC: Recent Labs  Lab 05/17/21 0301 05/18/21 0251 05/19/21 0243 05/21/21 0224 05/22/21 0349  WBC 8.0 8.8 7.6 9.6 13.7*  NEUTROABS 7.1 7.6 6.4 9.1* 12.5*  HGB 14.1 14.5 14.7 14.1 14.3  HCT 41.3 42.7 42.9 40.9 41.6  MCV 94.1 96.0 95.3 93.4 93.7  PLT 257 243 260 240 276    Basic Metabolic Panel: Recent Labs  Lab 05/18/21 0251 05/19/21 0243 05/20/21 0233 05/21/21 0224 05/22/21 0349  NA 138 138 134* 132* 135  K 4.5 4.3 4.5 4.4 4.8  CL 102 101 97* 99 100  CO2 28 28 31 26 26   GLUCOSE 143* 101* 97 186* 147*  BUN 34* 30* 21 26* 29*  CREATININE 0.84 0.77 0.88 0.76 0.80  CALCIUM 8.1* 8.1* 8.2* 8.1* 8.4*  MG  --   --  2.4 2.5* 2.5*  PHOS  --   --   --  3.2 4.1    GFR: Estimated Creatinine Clearance: 96.1 mL/min (by C-G formula based on SCr of 0.8 mg/dL).  Liver Function Tests: Recent Labs  Lab 05/17/21 0301 05/18/21 0251 05/19/21 0243 05/21/21 0224 05/22/21 0349  AST 70* 77* 76* 33 34  ALT 50* 67* 87* 54* 54*  ALKPHOS 57 56 54 52 55  BILITOT  0.9 1.0 1.0 0.8 1.0  PROT 6.9 6.6 6.6 6.4* 6.1*  ALBUMIN 2.7* 2.8* 2.8* 2.7* 2.5*    CBG: Recent Labs  Lab 05/21/21 1114 05/21/21 1637 05/21/21 2143 05/21/21 2208 05/22/21 0750  GLUCAP 226* 147* 142* 128* 131*     Recent Results (from the past 240 hour(s))  MRSA Next Gen by PCR, Nasal     Status: None   Collection Time: 05/14/21  4:06 PM  Specimen: Nasal Mucosa; Nasal Swab  Result Value Ref Range Status   MRSA by PCR Next Gen NOT DETECTED NOT DETECTED Final    Comment: (NOTE) The GeneXpert MRSA Assay (FDA approved for NASAL specimens only), is one component of [facility] comprehensive MRSA colonization surveillance program. It is not intended to diagnose MRSA infection nor to guide or monitor treatment for MRSA infections. Test performance is not FDA approved in patients less than 58 years old. Performed at [facility], [address], [place], [place] [address]          Radiology Studies: DG CHEST PORT 1 VIEW  Result Date: 05/21/2021 CLINICAL DATA:  Shortness of breath, hypoxemia EXAM: PORTABLE CHEST 1 VIEW COMPARISON:  05/17/2021 FINDINGS: Bilateral airspace disease again noted, left greater than right. Heart is borderline in size. No visible effusions or pneumothorax. No acute bony abnormality. IMPRESSION: Stable bilateral airspace disease, left greater than right. Electronically Signed   By: Charlett Nose M.D.   On: 05/21/2021 08:39        Scheduled Meds:  vitamin C  500 mg Oral Daily   baricitinib  4 mg Oral Daily   Chlorhexidine Gluconate Cloth  6 each Topical Daily   enoxaparin (LOVENOX) injection  0.5 mg/kg Subcutaneous Q24H   guaiFENesin  1,200 mg Oral BID   insulin aspart  0-9 Units Subcutaneous TID WC   insulin detemir  5 Units Subcutaneous BID   Ipratropium-Albuterol  1 puff Inhalation Q6H   linagliptin  5 mg Oral Daily   mouth rinse  15 mL Mouth Rinse BID   methylPREDNISolone (SOLU-MEDROL) injection  60 mg Intravenous Q12H   Followed  by   Melene Muller ON 05/23/2021] predniSONE  60 mg Oral Q breakfast   Followed by   Melene Muller ON 05/25/2021] predniSONE  40 mg Oral Q breakfast   Followed by   Melene Muller ON 05/27/2021] predniSONE  30 mg Oral Q breakfast   Followed by   Melene Muller ON 05/29/2021] predniSONE  20 mg Oral Q breakfast   Followed by   Melene Muller ON 05/31/2021] predniSONE  10 mg Oral Q breakfast   zinc sulfate  220 mg Oral Daily   Continuous Infusions:   LOS: 8 days    Time spent: over 30 min    Lacretia Nicks, MD [facility]   To contact the attending provider between 7A-7P or the covering provider during after hours 7P-7A, please log into the web site [email or web address] and access using universal [place] password for that web site. If you do not have the password, please call the hospital operator.  05/22/2021, 10:05 AM

## 2021-05-23 LAB — CBC WITH DIFFERENTIAL/PLATELET
Abs Immature Granulocytes: 0.19 10*3/uL — ABNORMAL HIGH (ref 0.00–0.07)
Basophils Absolute: 0 10*3/uL (ref 0.0–0.1)
Basophils Relative: 0 %
Eosinophils Absolute: 0 10*3/uL (ref 0.0–0.5)
Eosinophils Relative: 0 %
HCT: 40.8 % (ref 39.0–52.0)
Hemoglobin: 13.9 g/dL (ref 13.0–17.0)
Immature Granulocytes: 2 %
Lymphocytes Relative: 4 %
Lymphs Abs: 0.5 10*3/uL — ABNORMAL LOW (ref 0.7–4.0)
MCH: 32.4 pg (ref 26.0–34.0)
MCHC: 34.1 g/dL (ref 30.0–36.0)
MCV: 95.1 fL (ref 80.0–100.0)
Monocytes Absolute: 0.6 10*3/uL (ref 0.1–1.0)
Monocytes Relative: 5 %
Neutro Abs: 10.2 10*3/uL — ABNORMAL HIGH (ref 1.7–7.7)
Neutrophils Relative %: 89 %
Platelets: 256 10*3/uL (ref 150–400)
RBC: 4.29 MIL/uL (ref 4.22–5.81)
RDW: 13.1 % (ref 11.5–15.5)
WBC: 11.5 10*3/uL — ABNORMAL HIGH (ref 4.0–10.5)
nRBC: 0 % (ref 0.0–0.2)

## 2021-05-23 LAB — COMPREHENSIVE METABOLIC PANEL
ALT: 64 U/L — ABNORMAL HIGH (ref 0–44)
AST: 43 U/L — ABNORMAL HIGH (ref 15–41)
Albumin: 2.6 g/dL — ABNORMAL LOW (ref 3.5–5.0)
Alkaline Phosphatase: 56 U/L (ref 38–126)
Anion gap: 7 (ref 5–15)
BUN: 29 mg/dL — ABNORMAL HIGH (ref 8–23)
CO2: 25 mmol/L (ref 22–32)
Calcium: 8.2 mg/dL — ABNORMAL LOW (ref 8.9–10.3)
Chloride: 102 mmol/L (ref 98–111)
Creatinine, Ser: 0.88 mg/dL (ref 0.61–1.24)
GFR, Estimated: >60 mL/min (ref >60)
Glucose, Bld: 153 mg/dL — ABNORMAL HIGH (ref 70–99)
Potassium: 4.9 mmol/L (ref 3.5–5.1)
Sodium: 134 mmol/L — ABNORMAL LOW (ref 135–145)
Total Bilirubin: 0.9 mg/dL (ref 0.3–1.2)
Total Protein: 6.2 g/dL — ABNORMAL LOW (ref 6.5–8.1)

## 2021-05-23 LAB — GLUCOSE, CAPILLARY
Glucose-Capillary: 145 mg/dL — ABNORMAL HIGH (ref 70–99)
Glucose-Capillary: 95 mg/dL (ref 70–99)
Glucose-Capillary: 145 mg/dL — ABNORMAL HIGH (ref 70–99)
Glucose-Capillary: 100 mg/dL — ABNORMAL HIGH (ref 70–99)

## 2021-05-23 LAB — MAGNESIUM: Magnesium: 2.5 mg/dL — ABNORMAL HIGH (ref 1.7–2.4)

## 2021-05-23 LAB — PHOSPHORUS: Phosphorus: 3.9 mg/dL (ref 2.5–4.6)

## 2021-05-23 MED ORDER — FUROSEMIDE 40 MG PO TABS
40.0000 mg | ORAL_TABLET | Freq: Every day | ORAL | Status: DC
  Administered 2021-05-23 – 2021-05-27 (×5): 40 mg via ORAL
  Filled 2021-05-23 (×5): qty 1

## 2021-05-23 NOTE — Progress Notes (Signed)
Physical Therapy Treatment Patient Details Name: Thomas Hammond MRN: [id number] DOB: 07/07/1951 Today's Date: 05/23/2021    History of Present Illness 70 yo male admitted with COVID. Hx of asthma    PT Comments    Progressing with mobility. Dyspnea 2/4 O2 with ambulation : 94% on 15L                                   91% on 10L.  End of session: 93% on 12L   Follow Up Recommendations  Home health PT     Equipment Recommendations  None recommended by PT    Recommendations for Other Services       Precautions / Restrictions Precautions Precautions: Fall Precaution Comments: monitor O2; currently on HFNC Restrictions Weight Bearing Restrictions: No    Mobility  Bed Mobility               General bed mobility comments: oob in recliner    Transfers Overall transfer level: Needs assistance   Transfers: Sit to/from Stand Sit to Stand: Supervision         General transfer comment: Supv for safety  Ambulation/Gait Ambulation/Gait assistance: Min guard Gait Distance (Feet): 125 Feet (125'x1; 100'x1) Assistive device: None Gait Pattern/deviations: Step-through pattern;Decreased stride length     General Gait Details: Intermittently unsteady, especially with head turns. O2: 94% on 15L, 91% on 10L. Dyspnea 2/4   Stairs             Wheelchair Mobility    Modified Rankin (Stroke Patients Only)       Balance Overall balance assessment: Needs assistance         Standing balance support: During functional activity Standing balance-Leahy Scale: Fair                              Cognition Arousal/Alertness: Awake/alert Behavior During Therapy: WFL for tasks assessed/performed Overall Cognitive Status: Within Functional Limits for tasks assessed                                        Exercises      General Comments        Pertinent Vitals/Pain Pain Assessment: No/denies pain    Home Living                       Prior Function            PT Goals (current goals can now be found in the care plan section) Progress towards PT goals: Progressing toward goals    Frequency    Min 3X/week      PT Plan Current plan remains appropriate    Co-evaluation              AM-PAC PT "6 Clicks" Mobility   Outcome Measure  Help needed turning from your back to your side while in a flat bed without using bedrails?: None Help needed moving from lying on your back to sitting on the side of a flat bed without using bedrails?: None Help needed moving to and from a bed to a chair (including a wheelchair)?: A Little Help needed standing up from a chair using your arms (e.g., wheelchair or bedside chair)?: None Help needed to walk in hospital room?: A  Little Help needed climbing 3-5 steps with a railing? : A Little 6 Click Score: 21    End of Session Equipment Utilized During Treatment: Oxygen Activity Tolerance: Patient tolerated treatment well Patient left: in chair;with call bell/phone within reach;with chair alarm set   PT Visit Diagnosis: Unsteadiness on feet (R26.81);Difficulty in walking, not elsewhere classified (R26.2)     Time: 1130-1150 PT Time Calculation (min) (ACUTE ONLY): 20 min  Charges:  $Gait Training: 8-22 mins                         Faye Ramsay, PT Acute Rehabilitation  Office: [phone] Pager: [phone]

## 2021-05-23 NOTE — Progress Notes (Signed)
PROGRESS NOTE    Thomas Hammond  WUJ:[id number] DOB: December 15, 1950 DOA: 05/13/2021 PCP: Pcp, No   Chief Complaint  Patient presents with   Shortness of Breath   Brief Narrative:  70 year old male with history of mild intermittent asthma who was recently vaccinated against COVID-19, presented to the ED with ongoing dyspnea for 5 weeks, worse for past few days.  He was found to be in hypoxemic respiratory failure requiring 10 L/min of oxygen.  Patient was admitted with acute hypoxemic respiratory failure due to COVID-19 pneumonia.  He's currently stable, but critically ill requiring significant supplemental oxygen over the past several days.  Being treated with steroids and baricitinib, completed remdesivir.  Assessment & Plan:   Active Problems:   Pneumonia due to COVID-19 virus    Acute Hypoxic Respiratory Failure  COVID 19 Pneumonia  Currently requiring 12 L on [place](wean for sat >88%) CXR 7/28 with bilateral peripheral predominant heterogenous opacities CXR 8/1 with stable bilateral airspace disease L>R Continue steroids (7/24- present - will need taper after 10 days), baricitinib (day 10 out of 14) Completed course of remdesivir Lasix as tolerated for goal net negative Strict I/O (net negative 2.3 L), daily weights Procal 7/25 0.63, borderline - suspect presentation is related to covid and less likely bacterial infection, continue to hold abx Daily inflammatory markers - CRP and d dimer fluctuating, follow with steroids/baricitinib.  Patient was encouraged to prone, out of bed to chair, to use incentive spirometry and flutter valve.  COVID-19 Labs  Recent Labs    05/21/21 0224 05/22/21 0349  DDIMER 3.71* 2.81*  CRP 6.8* 1.5*     Lab Results  Component Value Date   SARSCOV2NAA POSITIVE (A) 05/09/2021   Elevated D dimer 2/2 covid 19 infection - CT PE protocol on 7/24 negative for PE and LE [place] on 7/29 negative for DVT  Hypotension Hypotensive 7/31 after lasix - improved  with bolus.  Will start on low-dose Lasix 40 mg p.o. daily for  Leukocytosis Likely 2/2 steroids, follow fever curve and clinical status.  T2DM New diagnosis.  Currently on Tradjenta, Levemir 5 units and SSI.  Continue this, blood sugar controlled.  Elevated LFT's  Hx Cholecystectomy Stable.Likely 2/2 covid 19 infection.  Acute viral hepatitis panel negative.  History of asthma No wheezing, continue steroids and inhalers  DVT prophylaxis: lovenox Code Status: partial  Family Communication: none at bedside Disposition:   Status is: Inpatient  Remains inpatient appropriate because:Inpatient level of care appropriate due to severity of illness  Dispo: The patient is from: Home              Anticipated d/c is to: Home              Patient currently is not medically stable to d/c.   Difficult to place patient No       Consultants:  None  Procedures: None  Antimicrobials: Anti-infectives (From admission, onward)    Start     Dose/Rate Route Frequency Ordered Stop   05/18/21 1000  remdesivir 100 mg in sodium chloride 0.9 % 100 mL IVPB        100 mg 200 mL/hr over 30 Minutes Intravenous Daily 05/17/21 1203 05/18/21 1042   05/15/21 1000  remdesivir 100 mg in sodium chloride 0.9 % 100 mL IVPB  Status:  Discontinued        100 mg 200 mL/hr over 30 Minutes Intravenous Daily 05/13/21 2353 05/17/21 1204   05/14/21 0000  remdesivir 100 mg in sodium chloride  0.9 % 100 mL IVPB        100 mg 200 mL/hr over 30 Minutes Intravenous Every 30 min 05/13/21 2353 05/14/21 0039          Subjective: Seen and examined.  He states that for the first time, he is feeling a lot better.  Denies any shortness of breath however he is currently on 2 L of oxygen. Objective: Vitals:   05/22/21 0554 05/22/21 1127 05/22/21 2000 05/23/21 0504  BP: (!) 117/54 107/64 113/68 117/75  Pulse: 64 79 69 (!) 58  Resp: 16 20 16 16   Temp: 97.7 F (36.5 C) 98 F (36.7 C) 97.9 F (36.6 C) 97.8 F (36.6  C)  TempSrc: Oral  Oral Oral  SpO2: 94% 96% 95% 92%  Weight:      Height:        Intake/Output Summary (Last 24 hours) at 05/23/2021 1142 Last data filed at 05/23/2021 0300 Gross per 24 hour  Intake 240 ml  Output 550 ml  Net -310 ml    Filed Weights   05/13/21 2132 05/21/21 0223 05/22/21 0500  Weight: 110.2 kg 99.3 kg 99.4 kg    Examination: General exam: Appears calm and comfortable  Respiratory system: Diminished breath sounds bilaterally, respiratory effort normal. Cardiovascular system: S1 & S2 heard, RRR. No JVD, murmurs, rubs, gallops or clicks. No pedal edema. Gastrointestinal system: Abdomen is nondistended, soft and nontender. No organomegaly or masses felt. Normal bowel sounds heard. Central nervous system: Alert and oriented. No focal neurological deficits. Extremities: Symmetric 5 x 5 power. Skin: No rashes, lesions or ulcers.  Psychiatry: Judgement and insight appear normal. Mood & affect appropriate.    Data Reviewed: I have personally reviewed following labs and imaging studies  CBC: Recent Labs  Lab 05/18/21 0251 05/19/21 0243 05/21/21 0224 05/22/21 0349 05/23/21 0354  WBC 8.8 7.6 9.6 13.7* 11.5*  NEUTROABS 7.6 6.4 9.1* 12.5* 10.2*  HGB 14.5 14.7 14.1 14.3 13.9  HCT 42.7 42.9 40.9 41.6 40.8  MCV 96.0 95.3 93.4 93.7 95.1  PLT 243 260 240 276 256     Basic Metabolic Panel: Recent Labs  Lab 05/19/21 0243 05/20/21 0233 05/21/21 0224 05/22/21 0349 05/23/21 0354  NA 138 134* 132* 135 134*  K 4.3 4.5 4.4 4.8 4.9  CL 101 97* 99 100 102  CO2 28 31 26 26 25   GLUCOSE 101* 97 186* 147* 153*  BUN 30* 21 26* 29* 29*  CREATININE 0.77 0.88 0.76 0.80 0.88  CALCIUM 8.1* 8.2* 8.1* 8.4* 8.2*  MG  --  2.4 2.5* 2.5* 2.5*  PHOS  --   --  3.2 4.1 3.9     GFR: Estimated Creatinine Clearance: 87.4 mL/min (by C-G formula based on SCr of 0.88 mg/dL).  Liver Function Tests: Recent Labs  Lab 05/18/21 0251 05/19/21 0243 05/21/21 0224 05/22/21 0349  05/23/21 0354  AST 77* 76* 33 34 43*  ALT 67* 87* 54* 54* 64*  ALKPHOS 56 54 52 55 56  BILITOT 1.0 1.0 0.8 1.0 0.9  PROT 6.6 6.6 6.4* 6.1* 6.2*  ALBUMIN 2.8* 2.8* 2.7* 2.5* 2.6*     CBG: Recent Labs  Lab 05/22/21 0750 05/22/21 1123 05/22/21 1719 05/22/21 2126 05/23/21 0724  GLUCAP 131* 197* 115* 122* 145*      Recent Results (from the past 240 hour(s))  MRSA Next Gen by PCR, Nasal     Status: None   Collection Time: 05/14/21  4:06 PM   Specimen: Nasal Mucosa; Nasal Swab  Result Value Ref Range Status   MRSA by PCR Next Gen NOT DETECTED NOT DETECTED Final    Comment: (NOTE) The GeneXpert MRSA Assay (FDA approved for NASAL specimens only), is one component of a comprehensive MRSA colonization surveillance program. It is not intended to diagnose MRSA infection nor to guide or monitor treatment for MRSA infections. Test performance is not FDA approved in patients less than 25 years old. Performed at [facility], [address] [place], [place] [address]           Radiology Studies: No results found.      Scheduled Meds:  vitamin C  500 mg Oral Daily   baricitinib  4 mg Oral Daily   enoxaparin (LOVENOX) injection  0.5 mg/kg Subcutaneous Q24H   guaiFENesin  1,200 mg Oral BID   insulin aspart  0-9 Units Subcutaneous TID WC   insulin detemir  5 Units Subcutaneous BID   Ipratropium-Albuterol  1 puff Inhalation Q6H   linagliptin  5 mg Oral Daily   mouth rinse  15 mL Mouth Rinse BID   pantoprazole  40 mg Oral Daily   predniSONE  60 mg Oral Q breakfast   Followed by   Melene Muller ON 05/25/2021] predniSONE  40 mg Oral Q breakfast   Followed by   Melene Muller ON 05/27/2021] predniSONE  30 mg Oral Q breakfast   Followed by   Melene Muller ON 05/29/2021] predniSONE  20 mg Oral Q breakfast   Followed by   Melene Muller ON 05/31/2021] predniSONE  10 mg Oral Q breakfast   zinc sulfate  220 mg Oral Daily   Continuous Infusions:   LOS: 9 days    Time spent: 35  minutes  Hughie Closs, MD [facility]   To contact the attending provider between 7A-7P or the covering provider during after hours 7P-7A, please log into the web site [email or web address] and access using universal [place] password for that web site. If you do not have the password, please call the hospital operator.  05/23/2021, 11:42 AM

## 2021-05-24 ENCOUNTER — Inpatient Hospital Stay (HOSPITAL_COMMUNITY): Payer: Medicare Other

## 2021-05-24 DIAGNOSIS — J9601 Acute respiratory failure with hypoxia: Secondary | ICD-10-CM

## 2021-05-24 DIAGNOSIS — E119 Type 2 diabetes mellitus without complications: Secondary | ICD-10-CM

## 2021-05-24 LAB — C-REACTIVE PROTEIN: CRP: 0.5 mg/dL (ref <1.0)

## 2021-05-24 LAB — CBC WITH DIFFERENTIAL/PLATELET
Abs Immature Granulocytes: 0.14 10*3/uL — ABNORMAL HIGH (ref 0.00–0.07)
Basophils Absolute: 0 10*3/uL (ref 0.0–0.1)
Basophils Relative: 0 %
Eosinophils Absolute: 0 10*3/uL (ref 0.0–0.5)
Eosinophils Relative: 0 %
HCT: 39.8 % (ref 39.0–52.0)
Hemoglobin: 13.4 g/dL (ref 13.0–17.0)
Immature Granulocytes: 1 %
Lymphocytes Relative: 12 %
Lymphs Abs: 1.3 10*3/uL (ref 0.7–4.0)
MCH: 32.2 pg (ref 26.0–34.0)
MCHC: 33.7 g/dL (ref 30.0–36.0)
MCV: 95.7 fL (ref 80.0–100.0)
Monocytes Absolute: 1 10*3/uL (ref 0.1–1.0)
Monocytes Relative: 10 %
Neutro Abs: 8.3 10*3/uL — ABNORMAL HIGH (ref 1.7–7.7)
Neutrophils Relative %: 77 %
Platelets: 237 10*3/uL (ref 150–400)
RBC: 4.16 MIL/uL — ABNORMAL LOW (ref 4.22–5.81)
RDW: 12.9 % (ref 11.5–15.5)
WBC: 10.8 10*3/uL — ABNORMAL HIGH (ref 4.0–10.5)
nRBC: 0 % (ref 0.0–0.2)

## 2021-05-24 LAB — GLUCOSE, CAPILLARY
Glucose-Capillary: 75 mg/dL (ref 70–99)
Glucose-Capillary: 114 mg/dL — ABNORMAL HIGH (ref 70–99)
Glucose-Capillary: 148 mg/dL — ABNORMAL HIGH (ref 70–99)

## 2021-05-24 LAB — COMPREHENSIVE METABOLIC PANEL
ALT: 66 U/L — ABNORMAL HIGH (ref 0–44)
AST: 40 U/L (ref 15–41)
Albumin: 2.8 g/dL — ABNORMAL LOW (ref 3.5–5.0)
Alkaline Phosphatase: 55 U/L (ref 38–126)
Anion gap: 8 (ref 5–15)
BUN: 28 mg/dL — ABNORMAL HIGH (ref 8–23)
CO2: 27 mmol/L (ref 22–32)
Calcium: 8.3 mg/dL — ABNORMAL LOW (ref 8.9–10.3)
Chloride: 102 mmol/L (ref 98–111)
Creatinine, Ser: 0.91 mg/dL (ref 0.61–1.24)
GFR, Estimated: >60 mL/min (ref >60)
Glucose, Bld: 85 mg/dL (ref 70–99)
Potassium: 4 mmol/L (ref 3.5–5.1)
Sodium: 137 mmol/L (ref 135–145)
Total Bilirubin: 0.9 mg/dL (ref 0.3–1.2)
Total Protein: 6.1 g/dL — ABNORMAL LOW (ref 6.5–8.1)

## 2021-05-24 LAB — D-DIMER, QUANTITATIVE: D-Dimer, Quant: 2.2 ug/mL-FEU — ABNORMAL HIGH (ref 0.00–0.50)

## 2021-05-24 NOTE — Plan of Care (Signed)
  Problem: Education: Goal: Knowledge of General Education information will improve Description: Including pain rating scale, medication(s)/side effects and non-pharmacologic comfort measures 05/24/2021 1524 by Dallie Piles, RN Outcome: Progressing 05/24/2021 1307 by Dallie Piles, RN Outcome: Progressing   Problem: Health Behavior/Discharge Planning: Goal: Ability to manage health-related needs will improve 05/24/2021 1524 by Dallie Piles, RN Outcome: Progressing 05/24/2021 1307 by Dallie Piles, RN Outcome: Progressing   Problem: Clinical Measurements: Goal: Ability to maintain clinical measurements within normal limits will improve 05/24/2021 1524 by Dallie Piles, RN Outcome: Progressing 05/24/2021 1307 by Dallie Piles, RN Outcome: Progressing Goal: Will remain free from infection 05/24/2021 1524 by Dallie Piles, RN Outcome: Progressing 05/24/2021 1307 by Dallie Piles, RN Outcome: Progressing Goal: Diagnostic test results will improve 05/24/2021 1524 by Dallie Piles, RN Outcome: Progressing 05/24/2021 1307 by Dallie Piles, RN Outcome: Progressing Goal: Respiratory complications will improve 05/24/2021 1524 by Dallie Piles, RN Outcome: Progressing 05/24/2021 1307 by Dallie Piles, RN Outcome: Progressing Goal: Cardiovascular complication will be avoided 05/24/2021 1524 by Dallie Piles, RN Outcome: Progressing 05/24/2021 1307 by Dallie Piles, RN Outcome: Progressing   Problem: Activity: Goal: Risk for activity intolerance will decrease 05/24/2021 1524 by Dallie Piles, RN Outcome: Progressing 05/24/2021 1307 by Dallie Piles, RN Outcome: Progressing   Problem: Nutrition: Goal: Adequate nutrition will be maintained 05/24/2021 1524 by Dallie Piles, RN Outcome: Progressing 05/24/2021 1307 by Dallie Piles, RN Outcome: Progressing   Problem:  Coping: Goal: Level of anxiety will decrease 05/24/2021 1524 by Dallie Piles, RN Outcome: Progressing 05/24/2021 1307 by Dallie Piles, RN Outcome: Progressing   Problem: Elimination: Goal: Will not experience complications related to bowel motility 05/24/2021 1524 by Dallie Piles, RN Outcome: Progressing 05/24/2021 1307 by Dallie Piles, RN Outcome: Progressing Goal: Will not experience complications related to urinary retention 05/24/2021 1524 by Dallie Piles, RN Outcome: Progressing 05/24/2021 1307 by Dallie Piles, RN Outcome: Progressing   Problem: Pain Managment: Goal: General experience of comfort will improve 05/24/2021 1524 by Dallie Piles, RN Outcome: Progressing 05/24/2021 1307 by Dallie Piles, RN Outcome: Progressing   Problem: Safety: Goal: Ability to remain free from injury will improve 05/24/2021 1524 by Dallie Piles, RN Outcome: Progressing 05/24/2021 1307 by Dallie Piles, RN Outcome: Progressing   Problem: Skin Integrity: Goal: Risk for impaired skin integrity will decrease 05/24/2021 1524 by Dallie Piles, RN Outcome: Progressing 05/24/2021 1307 by Dallie Piles, RN Outcome: Progressing   Problem: Education: Goal: Knowledge of disease or condition will improve Outcome: Progressing Goal: Knowledge of the prescribed therapeutic regimen will improve Outcome: Progressing Goal: Individualized Educational Video(s) Outcome: Progressing   Problem: Activity: Goal: Ability to tolerate increased activity will improve Outcome: Progressing Goal: Will verbalize the importance of balancing activity with adequate rest periods Outcome: Progressing   Problem: Respiratory: Goal: Ability to maintain a clear airway will improve Outcome: Progressing Goal: Levels of oxygenation will improve Outcome: Progressing

## 2021-05-24 NOTE — Care Management Important Message (Signed)
Important Message  Patient Details IM Letter given to the Patient. Name: Lissandro Dilorenzo MRN: [id number] Date of Birth: 1951/08/12   Medicare Important Message Given:  Yes     Caren Macadam 05/24/2021, 3:15 PM

## 2021-05-24 NOTE — Plan of Care (Signed)

## 2021-05-24 NOTE — Progress Notes (Signed)
PROGRESS NOTE    Thomas Hammond  WUJ:[id number] DOB: [birth year] DOA: 05/13/2021 PCP: Pcp, No   Chief Complaint  Patient presents with   Shortness of Breath   Brief Narrative:  70 year old male with history of mild intermittent asthma who was recently vaccinated against COVID-19, presented to the ED with ongoing dyspnea for 5 weeks, worse for past few days.  He was found to be in hypoxemic respiratory failure requiring 10 L/min of oxygen.  Patient was admitted with acute hypoxemic respiratory failure due to COVID-19 pneumonia.  He's currently stable, but critically ill requiring significant supplemental oxygen over the past several days.  Being treated with steroids and baricitinib, completed remdesivir.  Assessment & Plan:   Active Problems:   Pneumonia due to COVID-19 virus    Acute Hypoxic Respiratory Failure  COVID 19 Pneumonia  Still requiring 12 L on HFNC(wean for sat >88%) CXR 7/28 with bilateral peripheral predominant heterogenous opacities CXR 8/1 with stable bilateral airspace disease L>R CXR 8/4: Unchanged bilateral opacities. Started on tapering dose of steroids.  Continue baricitinib  Completed course of remdesivir Lasix as tolerated for goal net negative Strict I/O (net negative 2.7 L), daily weights Patient was encouraged to prone, out of bed to chair, to use incentive spirometry and flutter valve.  COVID-19 Labs  Recent Labs    05/22/21 0349 05/24/21 0407  DDIMER 2.81* 2.20*  CRP 1.5* 0.5     Lab Results  Component Value Date   SARSCOV2NAA POSITIVE (A) 05/09/2021   Elevated D dimer 2/2 covid 19 infection - CT PE protocol on 7/24 negative for PE and LE [place] on 7/29 negative for DVT  Hypotension Hypotensive 7/31 after lasix - improved with bolus.  Now stable.  Leukocytosis Likely 2/2 steroids, follow fever curve and clinical status.  T2DM New diagnosis.  Currently on Tradjenta, Levemir 5 units and SSI.  Continue this, blood sugar  controlled.  Elevated LFT's  Hx Cholecystectomy Stable.Likely 2/2 covid 19 infection.  Acute viral hepatitis panel negative.  History of asthma No wheezing, continue steroids and inhalers  DVT prophylaxis: lovenox Code Status: partial  Family Communication: none at bedside Disposition:   Status is: Inpatient  Remains inpatient appropriate because:Inpatient level of care appropriate due to severity of illness  Dispo: The patient is from: Home              Anticipated d/c is to: Home              Patient currently is not medically stable to d/c.   Difficult to place patient No       Consultants:  None  Procedures: None  Antimicrobials: Anti-infectives (From admission, onward)    Start     Dose/Rate Route Frequency Ordered Stop   05/18/21 1000  remdesivir 100 mg in sodium chloride 0.9 % 100 mL IVPB        100 mg 200 mL/hr over 30 Minutes Intravenous Daily 05/17/21 1203 05/18/21 1042   05/15/21 1000  remdesivir 100 mg in sodium chloride 0.9 % 100 mL IVPB  Status:  Discontinued        100 mg 200 mL/hr over 30 Minutes Intravenous Daily 05/13/21 2353 05/17/21 1204   05/14/21 0000  remdesivir 100 mg in sodium chloride 0.9 % 100 mL IVPB        100 mg 200 mL/hr over 30 Minutes Intravenous Every 30 min 05/13/21 2353 05/14/21 0039          Subjective: Seen and examined.  He states  that he continues to improve Denies any shortness of breath despite of requiring 12 L of oxygen.  Objective: Vitals:   05/23/21 1201 05/23/21 2122 05/24/21 0500 05/24/21 0501  BP: 109/71 112/72  108/74  Pulse: 63 (!) 57  (!) 57  Resp: 20 18  18   Temp: 97.6 F (36.4 C) 97.7 F (36.5 C)  98 F (36.7 C)  TempSrc:  Oral    SpO2: 97% 98%  94%  Weight:   99.1 kg   Height:        Intake/Output Summary (Last 24 hours) at 05/24/2021 1318 Last data filed at 05/24/2021 1154 Gross per 24 hour  Intake 594 ml  Output 1200 ml  Net -606 ml    Filed Weights   05/21/21 0223 05/22/21 0500  05/24/21 0500  Weight: 99.3 kg 99.4 kg 99.1 kg    Examination: General exam: Appears calm and comfortable  Respiratory system: Diminished breath sounds bilaterally. Respiratory effort normal. Cardiovascular system: S1 & S2 heard, RRR. No JVD, murmurs, rubs, gallops or clicks. No pedal edema. Gastrointestinal system: Abdomen is nondistended, soft and nontender. No organomegaly or masses felt. Normal bowel sounds heard. Central nervous system: Alert and oriented. No focal neurological deficits. Extremities: Symmetric 5 x 5 power. Skin: No rashes, lesions or ulcers.  Psychiatry: Judgement and insight appear normal. Mood & affect appropriate.   Data Reviewed: I have personally reviewed following labs and imaging studies  CBC: Recent Labs  Lab 05/19/21 0243 05/21/21 0224 05/22/21 0349 05/23/21 0354 05/24/21 0407  WBC 7.6 9.6 13.7* 11.5* 10.8*  NEUTROABS 6.4 9.1* 12.5* 10.2* 8.3*  HGB 14.7 14.1 14.3 13.9 13.4  HCT 42.9 40.9 41.6 40.8 39.8  MCV 95.3 93.4 93.7 95.1 95.7  PLT 260 240 276 256 237     Basic Metabolic Panel: Recent Labs  Lab 05/20/21 0233 05/21/21 0224 05/22/21 0349 05/23/21 0354 05/24/21 0407  NA 134* 132* 135 134* 137  K 4.5 4.4 4.8 4.9 4.0  CL 97* 99 100 102 102  CO2 31 26 26 25 27   GLUCOSE 97 186* 147* 153* 85  BUN 21 26* 29* 29* 28*  CREATININE 0.88 0.76 0.80 0.88 0.91  CALCIUM 8.2* 8.1* 8.4* 8.2* 8.3*  MG 2.4 2.5* 2.5* 2.5*  --   PHOS  --  3.2 4.1 3.9  --      GFR: Estimated Creatinine Clearance: 84.4 mL/min (by C-G formula based on SCr of 0.91 mg/dL).  Liver Function Tests: Recent Labs  Lab 05/19/21 0243 05/21/21 0224 05/22/21 0349 05/23/21 0354 05/24/21 0407  AST 76* 33 34 43* 40  ALT 87* 54* 54* 64* 66*  ALKPHOS 54 52 55 56 55  BILITOT 1.0 0.8 1.0 0.9 0.9  PROT 6.6 6.4* 6.1* 6.2* 6.1*  ALBUMIN 2.8* 2.7* 2.5* 2.6* 2.8*     CBG: Recent Labs  Lab 05/23/21 1159 05/23/21 1622 05/23/21 2124 05/24/21 0809 05/24/21 1152  GLUCAP  95 145* 100* 75 114*      Recent Results (from the past 240 hour(s))  MRSA Next Gen by PCR, Nasal     Status: None   Collection Time: 05/14/21  4:06 PM   Specimen: Nasal Mucosa; Nasal Swab  Result Value Ref Range Status   MRSA by PCR Next Gen NOT DETECTED NOT DETECTED Final    Comment: (NOTE) The GeneXpert MRSA Assay (FDA approved for NASAL specimens only), is one component of a comprehensive MRSA colonization surveillance program. It is not intended to diagnose MRSA infection nor to guide  or monitor treatment for MRSA infections. Test performance is not FDA approved in patients less than 44 years old. Performed at [facility], [address] [place], [place] [address]           Radiology Studies: DG CHEST PORT 1 VIEW  Result Date: 05/24/2021 CLINICAL DATA:  Shortness of breath and weakness EXAM: PORTABLE CHEST 1 VIEW COMPARISON:  Chest x-ray dated May 21, 2021 FINDINGS: Unchanged cardiac and mediastinal contours. Unchanged bilateral peripheral predominant patchy opacities. No large pleural effusion or pneumothorax. IMPRESSION: Unchanged bilateral heterogeneous opacities, compatible with COVID-19 infection. Electronically Signed   By: Allegra Lai MD   On: 05/24/2021 12:42        Scheduled Meds:  vitamin C  500 mg Oral Daily   baricitinib  4 mg Oral Daily   enoxaparin (LOVENOX) injection  0.5 mg/kg Subcutaneous Q24H   furosemide  40 mg Oral Daily   guaiFENesin  1,200 mg Oral BID   insulin aspart  0-9 Units Subcutaneous TID WC   insulin detemir  5 Units Subcutaneous BID   Ipratropium-Albuterol  1 puff Inhalation Q6H   linagliptin  5 mg Oral Daily   mouth rinse  15 mL Mouth Rinse BID   pantoprazole  40 mg Oral Daily   [START ON 05/25/2021] predniSONE  40 mg Oral Q breakfast   Followed by   Melene Muller ON 05/27/2021] predniSONE  30 mg Oral Q breakfast   Followed by   Melene Muller ON 05/29/2021] predniSONE  20 mg Oral Q breakfast   Followed by   Melene Muller ON  05/31/2021] predniSONE  10 mg Oral Q breakfast   zinc sulfate  220 mg Oral Daily   Continuous Infusions:   LOS: 10 days   Time spent: 30 minutes  Hughie Closs, MD [facility]  To contact the attending provider between 7A-7P or the covering provider during after hours 7P-7A, please log into the web site [email or web address] and access using universal [place] password for that web site. If you do not have the password, please call the hospital operator.  05/24/2021, 1:18 PM

## 2021-05-25 LAB — GLUCOSE, CAPILLARY
Glucose-Capillary: 119 mg/dL — ABNORMAL HIGH (ref 70–99)
Glucose-Capillary: 115 mg/dL — ABNORMAL HIGH (ref 70–99)
Glucose-Capillary: 140 mg/dL — ABNORMAL HIGH (ref 70–99)
Glucose-Capillary: 104 mg/dL — ABNORMAL HIGH (ref 70–99)

## 2021-05-25 LAB — D-DIMER, QUANTITATIVE: D-Dimer, Quant: 2.19 ug/mL-FEU — ABNORMAL HIGH (ref 0.00–0.50)

## 2021-05-25 LAB — COMPREHENSIVE METABOLIC PANEL
ALT: 87 U/L — ABNORMAL HIGH (ref 0–44)
AST: 49 U/L — ABNORMAL HIGH (ref 15–41)
Albumin: 2.7 g/dL — ABNORMAL LOW (ref 3.5–5.0)
Alkaline Phosphatase: 54 U/L (ref 38–126)
Anion gap: 7 (ref 5–15)
BUN: 22 mg/dL (ref 8–23)
CO2: 25 mmol/L (ref 22–32)
Calcium: 8.3 mg/dL — ABNORMAL LOW (ref 8.9–10.3)
Chloride: 104 mmol/L (ref 98–111)
Creatinine, Ser: 0.84 mg/dL (ref 0.61–1.24)
GFR, Estimated: >60 mL/min (ref >60)
Glucose, Bld: 98 mg/dL (ref 70–99)
Potassium: 3.9 mmol/L (ref 3.5–5.1)
Sodium: 136 mmol/L (ref 135–145)
Total Bilirubin: 0.8 mg/dL (ref 0.3–1.2)
Total Protein: 6 g/dL — ABNORMAL LOW (ref 6.5–8.1)

## 2021-05-25 LAB — CBC WITH DIFFERENTIAL/PLATELET
Abs Immature Granulocytes: 0.14 10*3/uL — ABNORMAL HIGH (ref 0.00–0.07)
Basophils Absolute: 0 10*3/uL (ref 0.0–0.1)
Basophils Relative: 0 %
Eosinophils Absolute: 0.1 10*3/uL (ref 0.0–0.5)
Eosinophils Relative: 1 %
HCT: 39.2 % (ref 39.0–52.0)
Hemoglobin: 13.4 g/dL (ref 13.0–17.0)
Immature Granulocytes: 1 %
Lymphocytes Relative: 11 %
Lymphs Abs: 1.1 10*3/uL (ref 0.7–4.0)
MCH: 32.5 pg (ref 26.0–34.0)
MCHC: 34.2 g/dL (ref 30.0–36.0)
MCV: 95.1 fL (ref 80.0–100.0)
Monocytes Absolute: 0.8 10*3/uL (ref 0.1–1.0)
Monocytes Relative: 8 %
Neutro Abs: 8.5 10*3/uL — ABNORMAL HIGH (ref 1.7–7.7)
Neutrophils Relative %: 79 %
Platelets: 244 10*3/uL (ref 150–400)
RBC: 4.12 MIL/uL — ABNORMAL LOW (ref 4.22–5.81)
RDW: 12.9 % (ref 11.5–15.5)
WBC: 10.7 10*3/uL — ABNORMAL HIGH (ref 4.0–10.5)
nRBC: 0 % (ref 0.0–0.2)

## 2021-05-25 LAB — C-REACTIVE PROTEIN: CRP: 0.6 mg/dL (ref <1.0)

## 2021-05-25 MED ORDER — ALBUTEROL SULFATE HFA 108 (90 BASE) MCG/ACT IN AERS: 4.0000 | INHALATION_SPRAY | RESPIRATORY_TRACT | Status: DC | PRN

## 2021-05-25 MED ORDER — IPRATROPIUM-ALBUTEROL 20-100 MCG/ACT IN AERS
2.0000 | INHALATION_SPRAY | Freq: Four times a day (QID) | RESPIRATORY_TRACT | Status: DC
  Administered 2021-05-25 – 2021-05-27 (×10): 2 via RESPIRATORY_TRACT

## 2021-05-25 NOTE — Progress Notes (Signed)
PROGRESS NOTE    Thomas Hammond  HEN:[id number] DOB: 22-Oct-1950 DOA: 05/13/2021 PCP: Pcp, No   Chief Complaint  Patient presents with   Shortness of Breath   Brief Narrative:  70 year old male with history of mild intermittent asthma who was recently vaccinated against COVID-19, presented to the ED with ongoing dyspnea for 5 weeks, worse for past few days.  He was found to be in hypoxemic respiratory failure requiring 10 L/min of oxygen.  Patient was admitted with acute hypoxemic respiratory failure due to COVID-19 pneumonia.  He's currently stable, but critically ill requiring significant supplemental oxygen over the past several days.  Being treated with steroids and baricitinib, completed remdesivir.  Assessment & Plan:   Active Problems:   Pneumonia due to COVID-19 virus   Acute respiratory failure with hypoxia (HCC)   Type 2 diabetes mellitus (HCC)    Acute Hypoxic Respiratory Failure  COVID 19 Pneumonia  Significant improvement compared to yesterday, now requiring only 8 L on HFNC(wean for sat >88%) CXR 7/28 with bilateral peripheral predominant heterogenous opacities CXR 8/1 with stable bilateral airspace disease L>R CXR 8/4: Unchanged bilateral opacities. Started on tapering dose of steroids.  Continue baricitinib  Completed course of remdesivir Continue Lasix as tolerated for goal net negative Strict I/O (net negative 4 L), daily weights Patient was encouraged to prone, out of bed to chair, to use incentive spirometry and flutter valve.  COVID-19 Labs  Recent Labs    05/24/21 0407 05/25/21 0353  DDIMER 2.20* 2.19*  CRP 0.5 0.6     Lab Results  Component Value Date   SARSCOV2NAA POSITIVE (A) 05/09/2021   Elevated D dimer 2/2 covid 19 infection - CT PE protocol on 7/24 negative for PE and LE [place] on 7/29 negative for DVT  Hypotension Hypotensive 7/31 after lasix - improved with bolus.  Now stable.  Leukocytosis Likely 2/2 steroids, follow fever curve and  clinical status.  T2DM New diagnosis.  Currently on Tradjenta, Levemir 5 units and SSI.  Continue this, blood sugar controlled.  Elevated LFT's  Hx Cholecystectomy Stable.Likely 2/2 covid 19 infection.  Acute viral hepatitis panel negative.  History of asthma No wheezing, continue steroids and inhalers  DVT prophylaxis: lovenox Code Status: partial  Family Communication: none at bedside Disposition:   Status is: Inpatient  Remains inpatient appropriate because:Inpatient level of care appropriate due to severity of illness  Dispo: The patient is from: Home              Anticipated d/c is to: Home              Patient currently is not medically stable to d/c.   Difficult to place patient No       Consultants:  None  Procedures: None  Antimicrobials: Anti-infectives (From admission, onward)    Start     Dose/Rate Route Frequency Ordered Stop   05/18/21 1000  remdesivir 100 mg in sodium chloride 0.9 % 100 mL IVPB        100 mg 200 mL/hr over 30 Minutes Intravenous Daily 05/17/21 1203 05/18/21 1042   05/15/21 1000  remdesivir 100 mg in sodium chloride 0.9 % 100 mL IVPB  Status:  Discontinued        100 mg 200 mL/hr over 30 Minutes Intravenous Daily 05/13/21 2353 05/17/21 1204   05/14/21 0000  remdesivir 100 mg in sodium chloride 0.9 % 100 mL IVPB        100 mg 200 mL/hr over 30 Minutes Intravenous Every  30 min 05/13/21 2353 05/14/21 0039          Subjective: Seen and examined.  No complaints.  Breathing improved.  Objective: Vitals:   05/24/21 1710 05/24/21 2129 05/25/21 0500 05/25/21 0512  BP: 105/78 128/76  105/63  Pulse: 69 64  64  Resp:  15  18  Temp: 97.9 F (36.6 C) 98.4 F (36.9 C)  97.9 F (36.6 C)  TempSrc: Oral Oral  Oral  SpO2: 98% 96%  98%  Weight:   98.5 kg   Height:        Intake/Output Summary (Last 24 hours) at 05/25/2021 1054 Last data filed at 05/25/2021 0947 Gross per 24 hour  Intake --  Output 1575 ml  Net -1575 ml    Filed  Weights   05/22/21 0500 05/24/21 0500 05/25/21 0500  Weight: 99.4 kg 99.1 kg 98.5 kg    Examination: General exam: Appears calm and comfortable, obese Respiratory system: Diminished breath sounds bilaterally. Respiratory effort normal. Cardiovascular system: S1 & S2 heard, RRR. No JVD, murmurs, rubs, gallops or clicks. No pedal edema. Gastrointestinal system: Abdomen is nondistended, soft and nontender. No organomegaly or masses felt. Normal bowel sounds heard. Central nervous system: Alert and oriented. No focal neurological deficits. Extremities: Symmetric 5 x 5 power. Skin: No rashes, lesions or ulcers.  Psychiatry: Judgement and insight appear normal. Mood & affect appropriate.    Data Reviewed: I have personally reviewed following labs and imaging studies  CBC: Recent Labs  Lab 05/21/21 0224 05/22/21 0349 05/23/21 0354 05/24/21 0407 05/25/21 0353  WBC 9.6 13.7* 11.5* 10.8* 10.7*  NEUTROABS 9.1* 12.5* 10.2* 8.3* 8.5*  HGB 14.1 14.3 13.9 13.4 13.4  HCT 40.9 41.6 40.8 39.8 39.2  MCV 93.4 93.7 95.1 95.7 95.1  PLT 240 276 256 237 244     Basic Metabolic Panel: Recent Labs  Lab 05/20/21 0233 05/21/21 0224 05/22/21 0349 05/23/21 0354 05/24/21 0407 05/25/21 0353  NA 134* 132* 135 134* 137 136  K 4.5 4.4 4.8 4.9 4.0 3.9  CL 97* 99 100 102 102 104  CO2 31 26 26 25 27 25   GLUCOSE 97 186* 147* 153* 85 98  BUN 21 26* 29* 29* 28* 22  CREATININE 0.88 0.76 0.80 0.88 0.91 0.84  CALCIUM 8.2* 8.1* 8.4* 8.2* 8.3* 8.3*  MG 2.4 2.5* 2.5* 2.5*  --   --   PHOS  --  3.2 4.1 3.9  --   --      GFR: Estimated Creatinine Clearance: 91.2 mL/min (by C-G formula based on SCr of 0.84 mg/dL).  Liver Function Tests: Recent Labs  Lab 05/21/21 0224 05/22/21 0349 05/23/21 0354 05/24/21 0407 05/25/21 0353  AST 33 34 43* 40 49*  ALT 54* 54* 64* 66* 87*  ALKPHOS 52 55 56 55 54  BILITOT 0.8 1.0 0.9 0.9 0.8  PROT 6.4* 6.1* 6.2* 6.1* 6.0*  ALBUMIN 2.7* 2.5* 2.6* 2.8* 2.7*      CBG: Recent Labs  Lab 05/23/21 2124 05/24/21 0809 05/24/21 1152 05/24/21 1653 05/25/21 0928  GLUCAP 100* 75 114* 148* 119*      No results found for this or any previous visit (from the past 240 hour(s)).         Radiology Studies: DG CHEST PORT 1 VIEW  Result Date: 05/24/2021 CLINICAL DATA:  Shortness of breath and weakness EXAM: PORTABLE CHEST 1 VIEW COMPARISON:  Chest x-ray dated May 21, 2021 FINDINGS: Unchanged cardiac and mediastinal contours. Unchanged bilateral peripheral predominant patchy opacities. No  large pleural effusion or pneumothorax. IMPRESSION: Unchanged bilateral heterogeneous opacities, compatible with COVID-19 infection. Electronically Signed   By: Allegra Lai MD   On: 05/24/2021 12:42        Scheduled Meds:  vitamin C  500 mg Oral Daily   baricitinib  4 mg Oral Daily   enoxaparin (LOVENOX) injection  0.5 mg/kg Subcutaneous Q24H   furosemide  40 mg Oral Daily   guaiFENesin  1,200 mg Oral BID   insulin aspart  0-9 Units Subcutaneous TID WC   insulin detemir  5 Units Subcutaneous BID   Ipratropium-Albuterol  2 puff Inhalation QID   linagliptin  5 mg Oral Daily   mouth rinse  15 mL Mouth Rinse BID   pantoprazole  40 mg Oral Daily   predniSONE  40 mg Oral Q breakfast   Followed by   Melene Muller ON 05/27/2021] predniSONE  30 mg Oral Q breakfast   Followed by   Melene Muller ON 05/29/2021] predniSONE  20 mg Oral Q breakfast   Followed by   Melene Muller ON 05/31/2021] predniSONE  10 mg Oral Q breakfast   zinc sulfate  220 mg Oral Daily   Continuous Infusions:   LOS: 11 days   Time spent: 28 minutes  Hughie Closs, MD [facility]  To contact the attending provider between 7A-7P or the covering provider during after hours 7P-7A, please log into the web site [email or web address] and access using universal [place] password for that web site. If you do not have the password, please call the hospital operator.  05/25/2021, 10:54 AM

## 2021-05-25 NOTE — Progress Notes (Signed)
Physical Therapy Treatment Patient Details Name: Thomas Hammond MRN: [id number] DOB: October 02, 1951 Today's Date: 05/25/2021    History of Present Illness 70 yo male admitted with COVID. Hx of asthma    PT Comments    The patient is eager to ambulate. Patient ambulated 125' x 3 with standing breaks to monitor SPO2 which remained > 91% throughout, Resting 955. Dyspnea 2/4.  Provided blue TB to perform UE exercises.   Follow Up Recommendations  Home health PT     Equipment Recommendations  None recommended by PT    Recommendations for Other Services       Precautions / Restrictions Precautions Precaution Comments: monitor O2; currently on HFNC    Mobility  Bed Mobility               General bed mobility comments: oob in recliner    Transfers     Transfers: Sit to/from Stand Sit to Stand: Supervision            Ambulation/Gait Ambulation/Gait assistance: Supervision;Min guard Gait Distance (Feet): 125 Feet (x3) Assistive device: None Gait Pattern/deviations: Step-through pattern;Decreased stride length Gait velocity: decr   General Gait Details: Intermittently unsteady, especially with head turns. O2: 8 L HFNC,89-95%. Dyspnea 2/4   Stairs             Wheelchair Mobility    Modified Rankin (Stroke Patients Only)       Balance Overall balance assessment: Mild deficits observed, not formally tested                                          Cognition Arousal/Alertness: Awake/alert                                            Exercises Other Exercises Other Exercises: blue TB for UE exercises    General Comments        Pertinent Vitals/Pain Pain Assessment: No/denies pain    Home Living                      Prior Function            PT Goals (current goals can now be found in the care plan section) Progress towards PT goals: Progressing toward goals    Frequency    Min  3X/week      PT Plan Current plan remains appropriate    Co-evaluation              AM-PAC PT "6 Clicks" Mobility   Outcome Measure  Help needed turning from your back to your side while in a flat bed without using bedrails?: None Help needed moving from lying on your back to sitting on the side of a flat bed without using bedrails?: None Help needed moving to and from a bed to a chair (including a wheelchair)?: None Help needed standing up from a chair using your arms (e.g., wheelchair or bedside chair)?: None Help needed to walk in hospital room?: A Little Help needed climbing 3-5 steps with a railing? : A Little 6 Click Score: 22    End of Session Equipment Utilized During Treatment: Oxygen Activity Tolerance: Patient tolerated treatment well Patient left: in chair;with call bell/phone within reach;with chair alarm set Nurse Communication: Mobility  status PT Visit Diagnosis: Unsteadiness on feet (R26.81);Difficulty in walking, not elsewhere classified (R26.2)     Time: 7824-2353 PT Time Calculation (min) (ACUTE ONLY): 43 min  Charges:  $Gait Training: 23-37 mins $Therapeutic Exercise: 8-22 mins                     Blanchard Kelch PT Acute Rehabilitation Services Pager [phone] Office [phone]    Rada Hay 05/25/2021, 3:52 PM

## 2021-05-26 LAB — GLUCOSE, CAPILLARY
Glucose-Capillary: 80 mg/dL (ref 70–99)
Glucose-Capillary: 110 mg/dL — ABNORMAL HIGH (ref 70–99)
Glucose-Capillary: 171 mg/dL — ABNORMAL HIGH (ref 70–99)
Glucose-Capillary: 131 mg/dL — ABNORMAL HIGH (ref 70–99)

## 2021-05-26 MED ORDER — ENOXAPARIN SODIUM 60 MG/0.6ML IJ SOSY: 0.5000 mg/kg | PREFILLED_SYRINGE | INTRAMUSCULAR | Status: DC

## 2021-05-26 MED ORDER — ENOXAPARIN SODIUM 60 MG/0.6ML IJ SOSY
0.5000 mg/kg | PREFILLED_SYRINGE | INTRAMUSCULAR | Status: DC
  Administered 2021-05-26: 50 mg via SUBCUTANEOUS
  Filled 2021-05-26: qty 0.6

## 2021-05-26 NOTE — Progress Notes (Signed)
PROGRESS NOTE    Thomas Hammond  AYT:[id number] DOB: 1951-02-10 DOA: 05/13/2021 PCP: Pcp, No   Chief Complaint  Patient presents with   Shortness of Breath   Brief Narrative:  70 year old male with history of mild intermittent asthma who was recently vaccinated against COVID-19, presented to the ED with ongoing dyspnea for 5 weeks, worse for past few days.  He was found to be in hypoxemic respiratory failure requiring 10 L/min of oxygen.  Patient was admitted with acute hypoxemic respiratory failure due to COVID-19 pneumonia.  Assessment & Plan:   Active Problems:   Pneumonia due to COVID-19 virus   Acute respiratory failure with hypoxia (HCC)   Type 2 diabetes mellitus (HCC)    Acute Hypoxic Respiratory Failure  COVID 19 Pneumonia  Once again, significant improvement compared to yesterday, now requiring only 3 L of oxygen (wean for sat >88%) CXR 7/28 with bilateral peripheral predominant heterogenous opacities CXR 8/1 with stable bilateral airspace disease L>R CXR 8/4: Unchanged bilateral opacities. Continue tapering dose of steroids.  Continue baricitinib  Completed course of remdesivir Continue Lasix as tolerated for goal net negative Strict I/O (net negative 4 L), daily weights Patient was encouraged to prone, out of bed to chair, to use incentive spirometry and flutter valve.  COVID-19 Labs  Recent Labs    05/24/21 0407 05/25/21 0353  DDIMER 2.20* 2.19*  CRP 0.5 0.6     Lab Results  Component Value Date   SARSCOV2NAA POSITIVE (A) 05/09/2021   Elevated D dimer 2/2 covid 19 infection - CT PE protocol on 7/24 negative for PE and LE [place] on 7/29 negative for DVT  Hypotension Hypotensive 7/31 after lasix - improved with bolus.  Now stable.  Leukocytosis Likely 2/2 steroids, follow fever curve and clinical status.  T2DM New diagnosis.  Currently on Tradjenta, Levemir 5 units and SSI.  Continue this, blood sugar controlled.  Elevated LFT's  Hx  Cholecystectomy Stable.Likely 2/2 covid 19 infection.  Acute viral hepatitis panel negative.  History of asthma No wheezing, continue steroids and inhalers  DVT prophylaxis: lovenox Code Status: partial  Family Communication: none at bedside Disposition:   Status is: Inpatient  Remains inpatient appropriate because:Inpatient level of care appropriate due to severity of illness  Dispo: The patient is from: Home              Anticipated d/c is to: Home              Patient currently is not medically stable to d/c.   Difficult to place patient No       Consultants:  None  Procedures: None  Antimicrobials: Anti-infectives (From admission, onward)    Start     Dose/Rate Route Frequency Ordered Stop   05/18/21 1000  remdesivir 100 mg in sodium chloride 0.9 % 100 mL IVPB        100 mg 200 mL/hr over 30 Minutes Intravenous Daily 05/17/21 1203 05/18/21 1042   05/15/21 1000  remdesivir 100 mg in sodium chloride 0.9 % 100 mL IVPB  Status:  Discontinued        100 mg 200 mL/hr over 30 Minutes Intravenous Daily 05/13/21 2353 05/17/21 1204   05/14/21 0000  remdesivir 100 mg in sodium chloride 0.9 % 100 mL IVPB        100 mg 200 mL/hr over 30 Minutes Intravenous Every 30 min 05/13/21 2353 05/14/21 0039          Subjective: Patient seen and examined.  His breathing  is improving.  Denies any shortness of breath.  No other complaint.  Objective: Vitals:   05/25/21 0512 05/25/21 1954 05/26/21 0504 05/26/21 0509  BP: 105/63 128/80  (!) 104/51  Pulse: 64 69  62  Resp: 18 20  18   Temp: 97.9 F (36.6 C) 98 F (36.7 C)  97.9 F (36.6 C)  TempSrc: Oral Oral  Oral  SpO2: 98% 93% 92% 93%  Weight:      Height:        Intake/Output Summary (Last 24 hours) at 05/26/2021 1106 Last data filed at 05/26/2021 0900 Gross per 24 hour  Intake 840 ml  Output 1400 ml  Net -560 ml    Filed Weights   05/22/21 0500 05/24/21 0500 05/25/21 0500  Weight: 99.4 kg 99.1 kg 98.5 kg     Examination: General exam: Appears calm and comfortable, obese Respiratory system: Diminished breath sounds bilaterally. Respiratory effort normal. Cardiovascular system: S1 & S2 heard, RRR. No JVD, murmurs, rubs, gallops or clicks. No pedal edema. Gastrointestinal system: Abdomen is nondistended, soft and nontender. No organomegaly or masses felt. Normal bowel sounds heard. Central nervous system: Alert and oriented. No focal neurological deficits. Extremities: Symmetric 5 x 5 power. Skin: No rashes, lesions or ulcers.  Psychiatry: Judgement and insight appear normal. Mood & affect appropriate.    Data Reviewed: I have personally reviewed following labs and imaging studies  CBC: Recent Labs  Lab 05/21/21 0224 05/22/21 0349 05/23/21 0354 05/24/21 0407 05/25/21 0353  WBC 9.6 13.7* 11.5* 10.8* 10.7*  NEUTROABS 9.1* 12.5* 10.2* 8.3* 8.5*  HGB 14.1 14.3 13.9 13.4 13.4  HCT 40.9 41.6 40.8 39.8 39.2  MCV 93.4 93.7 95.1 95.7 95.1  PLT 240 276 256 237 244     Basic Metabolic Panel: Recent Labs  Lab 05/20/21 0233 05/21/21 0224 05/22/21 0349 05/23/21 0354 05/24/21 0407 05/25/21 0353  NA 134* 132* 135 134* 137 136  K 4.5 4.4 4.8 4.9 4.0 3.9  CL 97* 99 100 102 102 104  CO2 31 26 26 25 27 25   GLUCOSE 97 186* 147* 153* 85 98  BUN 21 26* 29* 29* 28* 22  CREATININE 0.88 0.76 0.80 0.88 0.91 0.84  CALCIUM 8.2* 8.1* 8.4* 8.2* 8.3* 8.3*  MG 2.4 2.5* 2.5* 2.5*  --   --   PHOS  --  3.2 4.1 3.9  --   --      GFR: Estimated Creatinine Clearance: 91.2 mL/min (by C-G formula based on SCr of 0.84 mg/dL).  Liver Function Tests: Recent Labs  Lab 05/21/21 0224 05/22/21 0349 05/23/21 0354 05/24/21 0407 05/25/21 0353  AST 33 34 43* 40 49*  ALT 54* 54* 64* 66* 87*  ALKPHOS 52 55 56 55 54  BILITOT 0.8 1.0 0.9 0.9 0.8  PROT 6.4* 6.1* 6.2* 6.1* 6.0*  ALBUMIN 2.7* 2.5* 2.6* 2.8* 2.7*     CBG: Recent Labs  Lab 05/25/21 0928 05/25/21 1229 05/25/21 1633 05/25/21 2133  05/26/21 0736  GLUCAP 119* 115* 140* 104* 80      No results found for this or any previous visit (from the past 240 hour(s)).         Radiology Studies: No results found.      Scheduled Meds:  vitamin C  500 mg Oral Daily   baricitinib  4 mg Oral Daily   enoxaparin (LOVENOX) injection  0.5 mg/kg Subcutaneous Q24H   furosemide  40 mg Oral Daily   guaiFENesin  1,200 mg Oral BID   insulin  aspart  0-9 Units Subcutaneous TID WC   insulin detemir  5 Units Subcutaneous BID   Ipratropium-Albuterol  2 puff Inhalation QID   linagliptin  5 mg Oral Daily   mouth rinse  15 mL Mouth Rinse BID   pantoprazole  40 mg Oral Daily   [START ON 05/27/2021] predniSONE  30 mg Oral Q breakfast   Followed by   Melene Muller ON 05/29/2021] predniSONE  20 mg Oral Q breakfast   Followed by   Melene Muller ON 05/31/2021] predniSONE  10 mg Oral Q breakfast   zinc sulfate  220 mg Oral Daily   Continuous Infusions:   LOS: 12 days   Time spent: 27 minutes  Hughie Closs, MD [facility]  To contact the attending provider between 7A-7P or the covering provider during after hours 7P-7A, please log into the web site [email or web address] and access using universal [place] password for that web site. If you do not have the password, please call the hospital operator.  05/26/2021, 11:06 AM

## 2021-05-26 NOTE — Evaluation (Signed)
Occupational Therapy Evaluation Patient Details Name: Thomas Hammond MRN: [id number] DOB: 1951-06-23 Today's Date: 05/26/2021    History of Present Illness patient is a 70 yo male who was admitted with COVID. Hx of asthma   Clinical Impression   Patient evaluated by Occupational Therapy with no further acute OT needs identified. All education has been completed and the patient has no further questions. Patient was educated on shower seat, energy conservation strategies, pulse Ox and O2 cord management during ADLs.  See below for any follow-up Occupational Therapy or equipment needs. OT is signing off.      Follow Up Recommendations  No OT follow up    Equipment Recommendations  Tub/shower seat;Other (comment) (pulse ox)    Recommendations for Other Services       Precautions / Restrictions Precautions Precautions: Fall Precaution Comments: moitor O2 Restrictions Weight Bearing Restrictions: No      Mobility Bed Mobility               General bed mobility comments: oob in recliner    Transfers     Transfers: Sit to/from Stand;Stand Pivot Transfers Sit to Stand: Modified independent (Device/Increase time) Stand pivot transfers: Modified independent (Device/Increase time)            Balance Overall balance assessment: Modified Independent   Sitting balance-Leahy Scale: Good     Standing balance support: During functional activity Standing balance-Leahy Scale: Fair                             ADL either performed or assessed with clinical judgement   ADL Overall ADL's : Modified independent                                       General ADL Comments: patient is at baseline for ADLs at this time. patient was educated on how to move with O2 cord with patient able to demonstraet understanding during session.     Vision Patient Visual Report: No change from baseline       Perception     Praxis      Pertinent Vitals/Pain  Pain Assessment: No/denies pain     Hand Dominance Right   Extremity/Trunk Assessment Upper Extremity Assessment Upper Extremity Assessment: Overall WFL for tasks assessed   Lower Extremity Assessment Lower Extremity Assessment: Defer to PT evaluation   Cervical / Trunk Assessment Cervical / Trunk Assessment: Normal   Communication Communication Communication: No difficulties   Cognition Arousal/Alertness: Awake/alert Behavior During Therapy: WFL for tasks assessed/performed Overall Cognitive Status: Within Functional Limits for tasks assessed                                     General Comments       Exercises     Shoulder Instructions      Home Living Family/patient expects to be discharged to:: Private residence Living Arrangements: Alone   Type of Home: House Home Access: Stairs to enter Secretary/administrator of Steps: 2   Home Layout: One level     Bathroom Shower/Tub: Chief Strategy Officer: Standard     Home Equipment: None          Prior Functioning/Environment Level of Independence: Independent  OT Problem List:        OT Treatment/Interventions:      OT Goals(Current goals can be found in the care plan section)    OT Frequency:     Barriers to D/C:            Co-evaluation              AM-PAC OT "6 Clicks" Daily Activity     Outcome Measure Help from another person eating meals?: None Help from another person taking care of personal grooming?: None Help from another person toileting, which includes using toliet, bedpan, or urinal?: None Help from another person bathing (including washing, rinsing, drying)?: None Help from another person to put on and taking off regular upper body clothing?: None Help from another person to put on and taking off regular lower body clothing?: None 6 Click Score: 24   End of Session Equipment Utilized During Treatment: Gait belt Nurse  Communication: Other (comment) (cleared patient to participate)  Activity Tolerance: Patient tolerated treatment well Patient left: in chair;with call bell/phone within reach  OT Visit Diagnosis: Unsteadiness on feet (R26.81)                Time: 9407-6808 OT Time Calculation (min): 25 min Charges:  OT General Charges $OT Visit: 1 Visit OT Evaluation $OT Eval Low Complexity: 1 Low OT Treatments $Self Care/Home Management : 8-22 mins  Sharyn Blitz OTR/L, MS Acute Rehabilitation Department Office# [phone] Pager# [phone]   Chalmers Guest Mont Jagoda 05/26/2021, 3:18 PM

## 2021-05-27 LAB — GLUCOSE, CAPILLARY
Glucose-Capillary: 77 mg/dL (ref 70–99)
Glucose-Capillary: 114 mg/dL — ABNORMAL HIGH (ref 70–99)

## 2021-05-27 NOTE — Progress Notes (Signed)
Peter Congo to be D/C'd home with home health per MD order. Discussed with the patient and all questions fully answered. O2 delivered to room. Patient able to demonstrate correct use of portable tanks. Skin clean, dry and intact without evidence of skin break down, no evidence of skin tears noted.  IV catheter discontinued intact. Site without signs and symptoms of complications. Dressing and pressure applied.  An After Visit Summary was printed and given to the patient.  Patient escorted via WC, and D/C home via private auto.  Jon Gills  05/27/2021

## 2021-05-27 NOTE — Discharge Summary (Signed)
Physician Discharge Summary  Thomas Hammond XBM:[id number] DOB: 1951-05-12 DOA: 05/13/2021  PCP: Pcp, No  Admit date: 05/13/2021 Discharge date: 05/27/2021 30 Day Unplanned Readmission Risk Score    Flowsheet Row ED to Hosp-Admission (Current) from 05/13/2021 in [facility]TH FLOOR PROGR[facility]IVE C[facility] AND UROLOGY  30 Day Unplanned Readmission Risk Score (%) 14.69 Filed at 05/27/2021 0801       This score is the patient's risk of an unplanned readmission within 30 days of being discharged (0 -100%). The score is based on dignosis, age, lab data, medications, orders, and past utilization.   Low:  0-14.9   Medium: 15-21.9   High: 22-29.9   Extreme: 30 and above          Admitted From: Home Disposition: Home  Recommendations for Outpatient Follow-up:  Follow up with PCP in 1-2 weeks Please obtain BMP/CBC in one week and hemoglobin A1c and 10 weeks Please follow up with your PCP on the following pending results: Unresulted Labs (From admission, onward)    None         Home Health: Yes Equipment/Devices: Home oxygen  Discharge Condition: Stable CODE STATUS: Partial code Diet recommendation: Consistent carbohydrate  Subjective: Seen and examined.  Continues to feel well without any shortness of breath.  Excited to go home today.  Brief/Interim Summary: 70 year old male with history of mild intermittent asthma who was recently vaccinated against COVID-19, presented to the ED with ongoing dyspnea for 5 weeks, worse for past few days.  He was found to be in acute hypoxemic respiratory failure requiring 10 L/min of oxygen.  Patient was admitted with acute hypoxemic respiratory failure due to COVID-19 pneumonia.  He received 5 days of remdesivir, 10 days of prednisone as well as 13 days of baricitinib.  He had elevated D-dimer but he was ruled out of PE or DVT.  His LFTs were elevated likely secondary to COVID infection which also improved and he was ruled out of acute viral hepatitis panel.   Patient has completed all therapy for COVID-19 however he was continuing to require high amount of oxygen for last several days however for past 3 days, he has shown significant improvement.  He was requiring 12 L of oxygen 4 days ago and currently he is only requiring 3 L of oxygen with activity.  He is a stable to the point that he is being discharged home with home oxygen.  No further medications for COVID to go home with.  His hemoglobin A1c was checked and it was 6.5 however this was checked 6 days after he already had received steroids.  I do not think he has diabetes.  He may have prediabetes which can be controlled with diet and exercise.  He will be discharged in stable condition today.  Discharge Diagnoses:  Active Problems:   Pneumonia due to COVID-19 virus   Acute respiratory failure with hypoxia (HCC)   Type 2 diabetes mellitus (HCC)    Discharge Instructions   Allergies as of 05/27/2021   No Known Allergies      Medication List     STOP taking these medications    benzonatate 200 MG capsule Commonly known as: TESSALON       TAKE these medications    albuterol 108 (90 Base) MCG/ACT inhaler Commonly known as: VENTOLIN HFA Inhale 1-2 puffs into the lungs every 6 (six) hours as needed for wheezing or shortness of breath.   Bayer Aspirin 325 MG EC tablet Generic drug: aspirin Take 325 mg  by mouth every 8 (eight) hours as needed for pain.   OVER THE COUNTER MEDICATION Take 1 packet by mouth daily. Pack of vitamins from the natural food store   OVER THE COUNTER MEDICATION Take 10 mLs by mouth every 4 (four) hours as needed (cough). Generic cough syrup - unknown ingredients               Durable Medical Equipment  (From admission, onward)           Start     Ordered   05/27/21 1002  For home use only DME oxygen  Once       Question Answer Comment  Length of Need Lifetime   Liters per Minute 3   Frequency Continuous (stationary and portable oxygen  unit needed)   Oxygen delivery system Gas      05/27/21 1001            No Known Allergies  Consultations: None   Procedures/Studies: CT Angio Chest PE W/Cm &/Or Wo Cm  Result Date: 05/13/2021 CLINICAL DATA:  Pulmonary embolus suspected with low to intermediate probability. Positive D-dimer. EXAM: CT ANGIOGRAPHY CHEST WITH CONTRAST TECHNIQUE: Multidetector CT imaging of the chest was performed using the standard protocol during bolus administration of intravenous contrast. Multiplanar CT image reconstructions and MIPs were obtained to evaluate the vascular anatomy. CONTRAST:  OMNIPAQUE IOHEXOL 350 MG/ML SOLN COMPARISON:  Chest radiograph 05/13/2021 FINDINGS: Cardiovascular: Good opacification of the central and segmental pulmonary arteries. No focal filling defects. No evidence of significant pulmonary embolus. Motion artifact limits evaluation of the lower lungs. Normal heart size. No pericardial effusions. Normal caliber thoracic aorta. No aortic dissection. Great vessel origins are patent. Mediastinum/Nodes: Esophagus is decompressed. Small esophageal hiatal hernia. Scattered calcified lymph nodes. No significant lymphadenopathy. Thyroid gland is unremarkable. Lungs/Pleura: Diffuse distribution of somewhat patchy airspace disease throughout both lungs with a mostly peripheral distribution pattern. Changes likely to represent multifocal pneumonia. No pleural effusions. No pneumothorax. Upper Abdomen: Surgical absence of the gallbladder. No acute abnormalities demonstrated in the visualized upper abdomen. Musculoskeletal: Degenerative changes in the spine. No destructive bone lesions. Review of the MIP images confirms the above findings. IMPRESSION: 1. No evidence of significant pulmonary embolus. 2. Diffuse airspace disease throughout the lungs with peripheral distribution. Most likely represents multifocal pneumonia. Electronically Signed   By: Burman Nieves M.D.   On: 05/13/2021 23:45    DG CHEST PORT 1 VIEW  Result Date: 05/24/2021 CLINICAL DATA:  Shortness of breath and weakness EXAM: PORTABLE CHEST 1 VIEW COMPARISON:  Chest x-ray dated May 21, 2021 FINDINGS: Unchanged cardiac and mediastinal contours. Unchanged bilateral peripheral predominant patchy opacities. No large pleural effusion or pneumothorax. IMPRESSION: Unchanged bilateral heterogeneous opacities, compatible with COVID-19 infection. Electronically Signed   By: Allegra Lai MD   On: 05/24/2021 12:42   DG CHEST PORT 1 VIEW  Result Date: 05/21/2021 CLINICAL DATA:  Shortness of breath, hypoxemia EXAM: PORTABLE CHEST 1 VIEW COMPARISON:  05/17/2021 FINDINGS: Bilateral airspace disease again noted, left greater than right. Heart is borderline in size. No visible effusions or pneumothorax. No acute bony abnormality. IMPRESSION: Stable bilateral airspace disease, left greater than right. Electronically Signed   By: Charlett Nose M.D.   On: 05/21/2021 08:39   DG CHEST PORT 1 VIEW  Result Date: 05/17/2021 CLINICAL DATA:  Shortness of breath, cough, history of COVID EXAM: PORTABLE CHEST 1 VIEW COMPARISON:  May 13, 2021 FINDINGS: The cardiomediastinal silhouette is unchanged in contour. No pleural effusion. No  pneumothorax. Increased bilateral peripheral predominant heterogeneous opacities, consistent with the sequela of COVID-19 infection. Visualized abdomen is unremarkable. Multilevel degenerative changes of the thoracic spine. Status post LEFT rotator cuff repair. IMPRESSION: Increased bilateral peripheral predominant heterogeneous opacities, consistent with the sequela of COVID-19 infection. Electronically Signed   By: Meda Klinefelter MD   On: 05/17/2021 12:49   DG Chest Port 1 View  Result Date: 05/13/2021 CLINICAL DATA:  Short of breath for 6 weeks. EXAM: PORTABLE CHEST 1 VIEW COMPARISON:  05/09/2021. FINDINGS: Cardiac silhouette is borderline enlarged. No mediastinal or hilar masses. Hazy opacities noted in the mid  to lower lung zones with associated interstitial prominence. Findings are similar to the prior study. No new lung abnormalities. No convincing pleural effusion and no pneumothorax. Previous left shoulder rotator cuff surgery. Skeletal structures grossly intact. IMPRESSION: 1. No interval change from the study performed 4 days ago. 2. Subtle hazy opacities in the mid to lower lungs, most evident at the left lung base, which may reflect multifocal pneumonia with atypical etiologies including viral infection in the differential. Patient reportedly did test positive for COVID-19. Electronically Signed   By: Amie Portland M.D.   On: 05/13/2021 22:34   DG Chest Port 1 View  Result Date: 05/09/2021 CLINICAL DATA:  Shortness of breath with exertion EXAM: PORTABLE CHEST 1 VIEW COMPARISON:  None. FINDINGS: Enlarged cardiac silhouette, likely accentuated by AP technique. Ill-defined interstitial and airspace opacities at the lateral left greater than right lung bases. No large pleural effusion or visible pneumothorax on this single semi erect radiograph. IMPRESSION: 1. Ill-defined interstitial and patchy airspace opacities in the peripheral left greater than right lung bases, potentially pneumonia. Consider correlation with COVID testing. Followup PA and lateral chest X-ray is recommended in 3-4 weeks to ensure resolution. 2. Cardiomegaly. Electronically Signed   By: Feliberto Harts MD   On: 05/09/2021 14:05   VAS [place] LOWER EXTREMITY VENOUS (DVT)  Result Date: 05/18/2021  Lower Venous DVT Study Patient Name:  Thomas Hammond  Date of Exam:   05/18/2021 Medical Rec #: [id number]    Accession #:    [id number] Date of Birth: June 15, 1951   Patient Gender: M Patient Age:   69Y Exam Location:  [facility] Procedure:      VAS [place] LOWER EXTREMITY VENOUS (DVT) Referring Phys: [id number] Ill LAMA --------------------------------------------------------------------------------  Indications: Elevated d-dimer, COVID.  Comparison  Study: No prior study Performing Technologist: Gertie Fey MHA, RDMS, RVT, RDCS  Examination Guidelines: A complete evaluation includes B-mode imaging, spectral Doppler, color Doppler, and power Doppler as needed of all accessible portions of each vessel. Bilateral testing is considered an integral part of a complete examination. Limited examinations for reoccurring indications may be performed as noted. The reflux portion of the exam is performed with the patient in reverse Trendelenburg.  +---------+---------------+---------+-----------+----------+--------------+ RIGHT    CompressibilityPhasicitySpontaneityPropertiesThrombus Aging +---------+---------------+---------+-----------+----------+--------------+ CFV      Full           Yes      Yes                                 +---------+---------------+---------+-----------+----------+--------------+ SFJ      Full                                                        +---------+---------------+---------+-----------+----------+--------------+  FV Prox  Full                                                        +---------+---------------+---------+-----------+----------+--------------+ FV Mid   Full                                                        +---------+---------------+---------+-----------+----------+--------------+ FV DistalFull                                                        +---------+---------------+---------+-----------+----------+--------------+ PFV      Full                                                        +---------+---------------+---------+-----------+----------+--------------+ POP      Full           Yes      Yes                                 +---------+---------------+---------+-----------+----------+--------------+ PTV      Full                                                        +---------+---------------+---------+-----------+----------+--------------+  PERO     Full                                                        +---------+---------------+---------+-----------+----------+--------------+   +---------+---------------+---------+-----------+----------+--------------+ LEFT     CompressibilityPhasicitySpontaneityPropertiesThrombus Aging +---------+---------------+---------+-----------+----------+--------------+ CFV      Full           Yes      Yes                                 +---------+---------------+---------+-----------+----------+--------------+ SFJ      Full                                                        +---------+---------------+---------+-----------+----------+--------------+ FV Prox  Full                                                        +---------+---------------+---------+-----------+----------+--------------+  FV Mid   Full                                                        +---------+---------------+---------+-----------+----------+--------------+ FV DistalFull                                                        +---------+---------------+---------+-----------+----------+--------------+ PFV      Full                                                        +---------+---------------+---------+-----------+----------+--------------+ POP      Full           Yes      Yes                                 +---------+---------------+---------+-----------+----------+--------------+ PTV      Full                                                        +---------+---------------+---------+-----------+----------+--------------+ PERO     Full                                                        +---------+---------------+---------+-----------+----------+--------------+     Summary: RIGHT: - There is no evidence of deep vein thrombosis in the lower extremity.  - No cystic structure found in the popliteal fossa.  LEFT: - There is no evidence of deep vein thrombosis  in the lower extremity.  - No cystic structure found in the popliteal fossa.  *See table(s) above for measurements and observations. Electronically signed by Waverly Ferrarihristopher Dickson MD on 05/18/2021 at 5:23:14 PM.    Final      Discharge Exam: Vitals:   05/26/21 2033 05/27/21 0510  BP: 106/69 127/69  Pulse: 65 (!) 58  Resp: 20 18  Temp: 97.8 F (36.6 C) 97.9 F (36.6 C)  SpO2: 94% 96%   Vitals:   05/26/21 0509 05/26/21 1335 05/26/21 2033 05/27/21 0510  BP: (!) 104/51 101/63 106/69 127/69  Pulse: 62 70 65 (!) 58  Resp: 18 (!) 23 20 18   Temp: 97.9 F (36.6 C) 97.8 F (36.6 C) 97.8 F (36.6 C) 97.9 F (36.6 C)  TempSrc: Oral Oral Oral Oral  SpO2: 93% 96% 94% 96%  Weight:      Height:        General: Pt is alert, awake, not in acute distress, obese Cardiovascular: RRR, S1/S2 +, no rubs, no gallops Respiratory: CTA bilaterally, no wheezing, no rhonchi Abdominal: Soft, NT, ND, bowel sounds + Extremities: no edema, no cyanosis  The results of significant diagnostics from this hospitalization (including imaging, microbiology, ancillary and laboratory) are listed below for reference.     Microbiology: No results found for this or any previous visit (from the past 240 hour(s)).   Labs: BNP (last 3 results) Recent Labs    05/09/21 1433 05/19/21 0243 05/20/21 0233  BNP 11.8 41.1 23.5   Basic Metabolic Panel: Recent Labs  Lab 05/21/21 0224 05/22/21 0349 05/23/21 0354 05/24/21 0407 05/25/21 0353  NA 132* 135 134* 137 136  K 4.4 4.8 4.9 4.0 3.9  CL 99 100 102 102 104  CO2 GLUCOSE 186* 147* 153* 85 98  BUN 26* 29* 29* 28* 22  CREATININE 0.76 0.80 0.88 0.91 0.84  CALCIUM 8.1* 8.4* 8.2* 8.3* 8.3*  MG 2.5* 2.5* 2.5*  --   --   PHOS 3.2 4.1 3.9  --   --    Liver Function Tests: Recent Labs  Lab 05/21/21 0224 05/22/21 0349 05/23/21 0354 05/24/21 0407 05/25/21 0353  AST 33 34 43* 40 49*  ALT 54* 54* 64* 66* 87*  ALKPHOS 52 55 56 55 54  BILITOT  0.8 1.0 0.9 0.9 0.8  PROT 6.4* 6.1* 6.2* 6.1* 6.0*  ALBUMIN 2.7* 2.5* 2.6* 2.8* 2.7*   No results for input(s): LIPASE, AMYLASE in the last 168 hours. No results for input(s): AMMONIA in the last 168 hours. CBC: Recent Labs  Lab 05/21/21 0224 05/22/21 0349 05/23/21 0354 05/24/21 0407 05/25/21 0353  WBC 9.6 13.7* 11.5* 10.8* 10.7*  NEUTROABS 9.1* 12.5* 10.2* 8.3* 8.5*  HGB 14.1 14.3 13.9 13.4 13.4  HCT 40.9 41.6 40.8 39.8 39.2  MCV 93.4 93.7 95.1 95.7 95.1  PLT 240 276 256 237 244   Cardiac Enzymes: No results for input(s): CKTOTAL, CKMB, CKMBINDEX, TROPONINI in the last 168 hours. BNP: Invalid input(s): POCBNP CBG: Recent Labs  Lab 05/26/21 0736 05/26/21 1152 05/26/21 1615 05/26/21 2036 05/27/21 0807  GLUCAP 80 110* 171* 131* 77   D-Dimer Recent Labs    05/25/21 0353  DDIMER 2.19*   Hgb A1c No results for input(s): HGBA1C in the last 72 hours. Lipid Profile No results for input(s): CHOL, HDL, LDLCALC, TRIG, CHOLHDL, LDLDIRECT in the last 72 hours. Thyroid function studies No results for input(s): TSH, T4TOTAL, T3FREE, THYROIDAB in the last 72 hours.  Invalid input(s): FREET3 Anemia work up No results for input(s): VITAMINB12, FOLATE, FERRITIN, TIBC, IRON, RETICCTPCT in the last 72 hours. Urinalysis No results found for: COLORURINE, APPEARANCEUR, LABSPEC, PHURINE, GLUCOSEU, HGBUR, BILIRUBINUR, KETONESUR, PROTEINUR, UROBILINOGEN, NITRITE, LEUKOCYTESUR Sepsis Labs Invalid input(s): PROCALCITONIN,  WBC,  LACTICIDVEN Microbiology No results found for this or any previous visit (from the past 240 hour(s)).   Time coordinating discharge: Over 30 minutes  SIGNED:   Hughie Closs, MD  [facility] 05/27/2021, 10:03 AM  If 7PM-7AM, please contact night-coverage [email or web address]

## 2021-05-27 NOTE — TOC Progression Note (Signed)
Transition of Care [facility]) - Progression Note    Patient Details  Name: Thomas Hammond MRN: [id number] Date of Birth: 06-26-1951  Transition of Care [facility]) CM/SW Contact  Jadin Kagel, [place], [place] Phone Number: 05/27/2021, 11:03 AM  Clinical Narrative:     Order received to arrange home oxygen patient.  Home health agency preference discussed with patient, but he denied knowledge of various durable medical equipment agencies.  LCSW arranged home oxygen for patient through [employer] and tank will be delivered to patient's room prior to discharge.  Patient is scheduled for discharge home today.  No additional social work needs identified.  Expected Discharge Plan: Home/Self Care Barriers to Discharge: Continued Medical Work up  Expected Discharge Plan and Services Expected Discharge Plan: Home/Self Care   Discharge Planning Services: CM Consult   Living arrangements for the past 2 months: Single Family Home Expected Discharge Date: 05/27/21                Social Determinants of Health (SDOH) Interventions   DME arranged.  Readmission Risk Interventions No flowsheet data found.  Danford Bad, BSW, MSW, [employer]  Licensed Visual merchandiser  [employer]  Mailing Address-[address] [place], [place] [address] Physical Address-[address] [place], [place] [address] Toll Free Main # [phone] Fax # [phone] Cell # [phone]  [email or web address]

## 2021-05-27 NOTE — Progress Notes (Signed)
SATURATION QUALIFICATIONS: (This note is used to comply with regulatory documentation for home oxygen)  Patient Saturations on Room Air at Rest = 87%  Patient Saturations on Room Air while Ambulating = 82%  Patient Saturations on 3 Liters of oxygen while Ambulating = 92%  Please briefly explain why patient needs home oxygen: Patient desaturates to 82% while ambulating on room air. Requires 3L during ambulation and 2L at rest to maintain saturations above 88%.

## 2021-06-21 DIAGNOSIS — J4520 Mild intermittent asthma, uncomplicated: Secondary | ICD-10-CM | POA: Insufficient documentation

## 2021-09-21 DIAGNOSIS — I10 Essential (primary) hypertension: Secondary | ICD-10-CM | POA: Insufficient documentation

## 2021-09-21 DIAGNOSIS — M1711 Unilateral primary osteoarthritis, right knee: Secondary | ICD-10-CM | POA: Insufficient documentation

## 2021-10-08 DIAGNOSIS — R7303 Prediabetes: Secondary | ICD-10-CM | POA: Insufficient documentation

## 2021-10-08 DIAGNOSIS — E119 Type 2 diabetes mellitus without complications: Secondary | ICD-10-CM | POA: Insufficient documentation

## 2022-02-07 DIAGNOSIS — E7800 Pure hypercholesterolemia, unspecified: Secondary | ICD-10-CM | POA: Insufficient documentation

## 2022-05-23 IMAGING — DX DG CHEST 1V PORT
1 series · 1 of 1 positions shown · non-contrast
Comparison: None.

CLINICAL DATA: Shortness of breath with exertion

EXAM:
PORTABLE CHEST 1 VIEW

[chest ap]
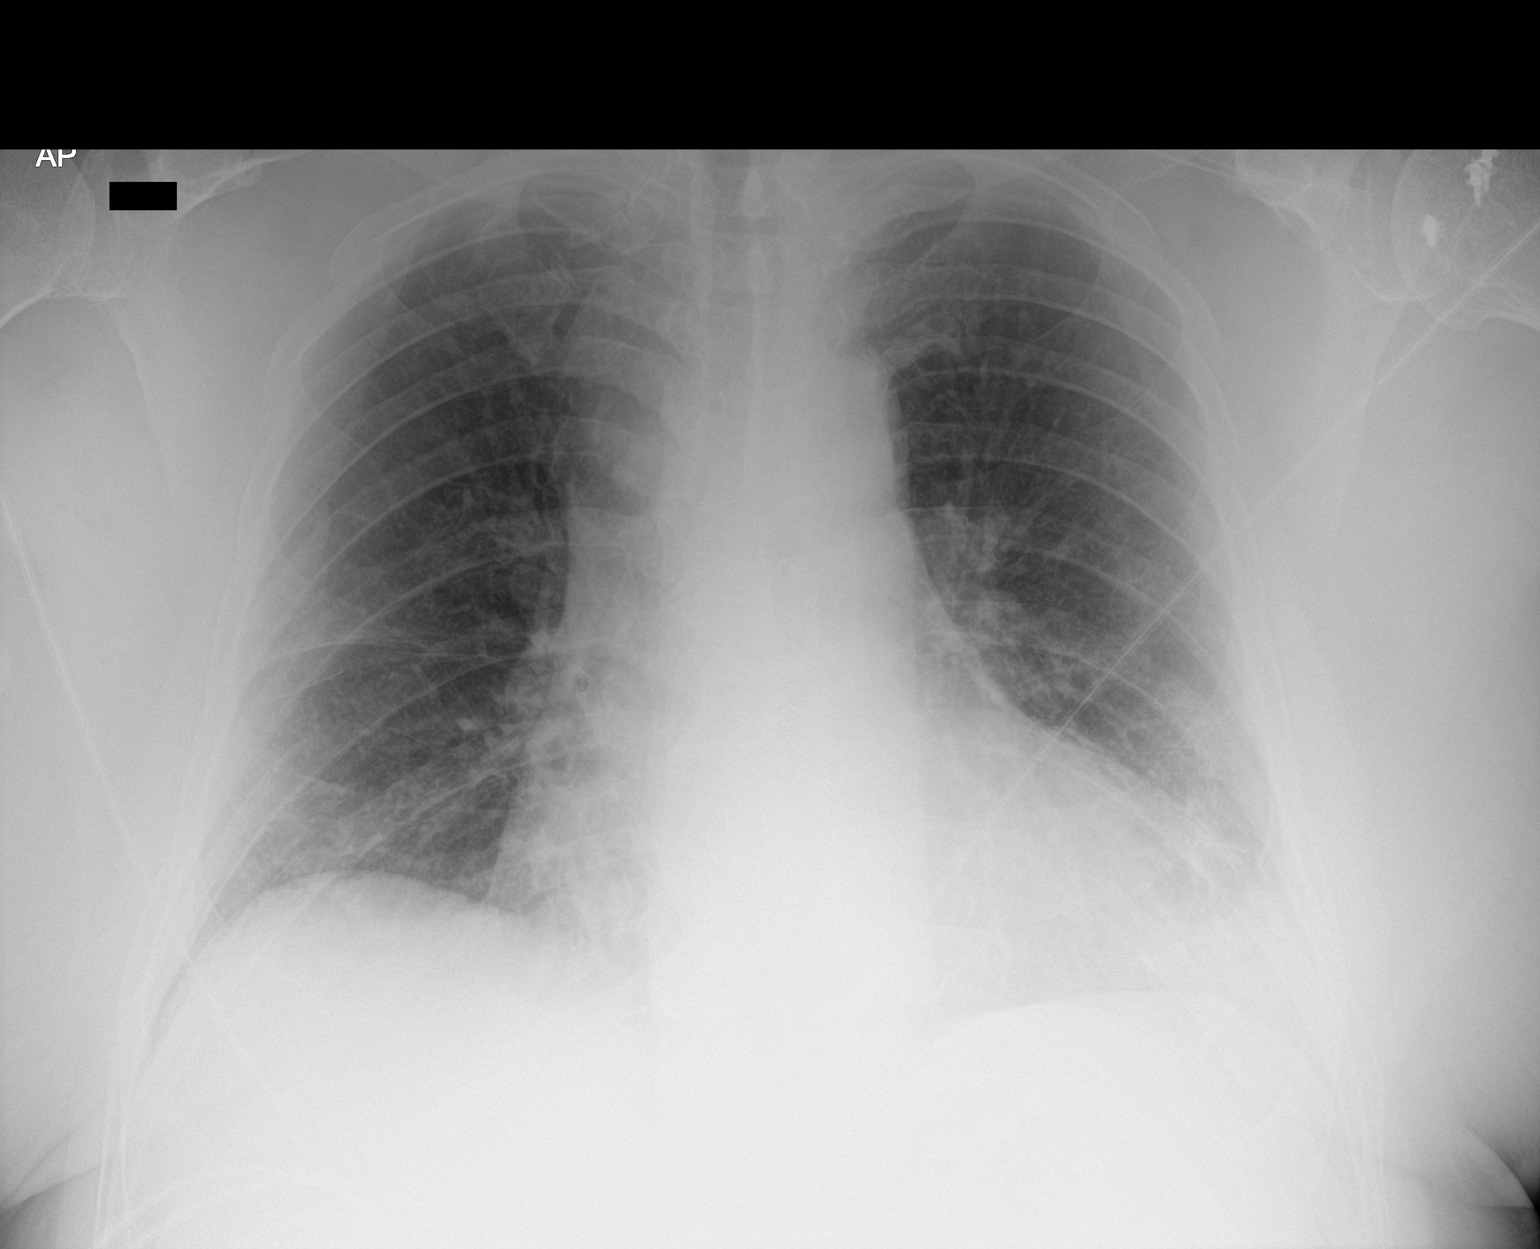

[1 of 1 positions shown; findings below may reference images not displayed]

FINDINGS: Enlarged cardiac silhouette, likely accentuated by AP technique.
Ill-defined interstitial and airspace opacities at the lateral left
greater than right lung bases. No large pleural effusion or visible
pneumothorax on this single semi erect radiograph.
IMPRESSION: 1. Ill-defined interstitial and patchy airspace opacities in the
peripheral left greater than right lung bases, potentially
pneumonia. Consider correlation with COVID testing. Followup PA and
lateral chest X-ray is recommended in 3-4 weeks to ensure
resolution.
2. Cardiomegaly.

## 2022-05-27 IMAGING — DX DG CHEST 1V PORT
1 series · 1 of 1 positions shown · non-contrast
Comparison: 05/09/2021.

CLINICAL DATA: Short of breath for 6 weeks.

EXAM:
PORTABLE CHEST 1 VIEW

[chest ap]
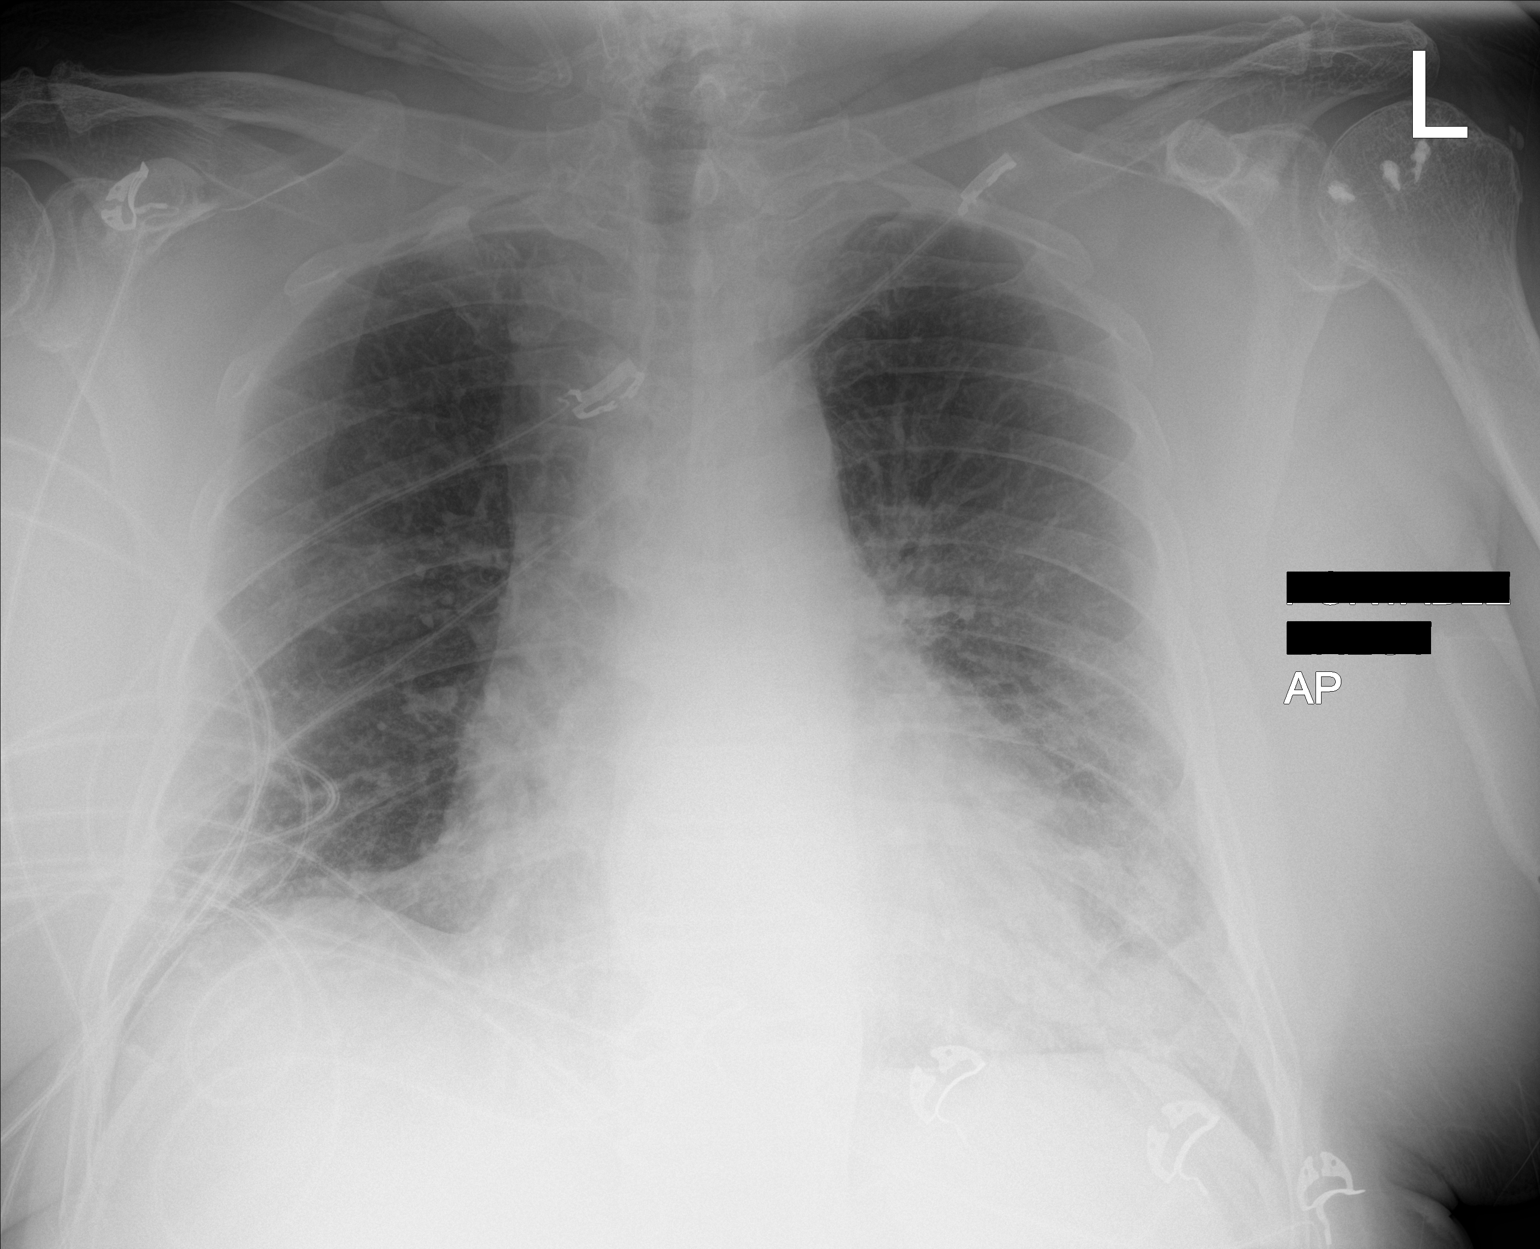

[1 of 1 positions shown; findings below may reference images not displayed]

FINDINGS: Cardiac silhouette is borderline enlarged. No mediastinal or hilar
masses.

Hazy opacities noted in the mid to lower lung zones with associated
interstitial prominence. Findings are similar to the prior study. No
new lung abnormalities.

No convincing pleural effusion and no pneumothorax.

Previous left shoulder rotator cuff surgery. Skeletal structures
grossly intact.
IMPRESSION: 1. No interval change from the study performed 4 days ago.
2. Subtle hazy opacities in the mid to lower lungs, most evident at
the left lung base, which may reflect multifocal pneumonia with
atypical etiologies including viral infection in the differential.
Patient reportedly did test positive for [id number].

## 2022-05-31 IMAGING — DX DG CHEST 1V PORT
1 series · 1 of 1 positions shown · non-contrast
Comparison: May 13, 2021

CLINICAL DATA: Shortness of breath, cough, history of COVID

EXAM:
PORTABLE CHEST 1 VIEW

[chest ap]
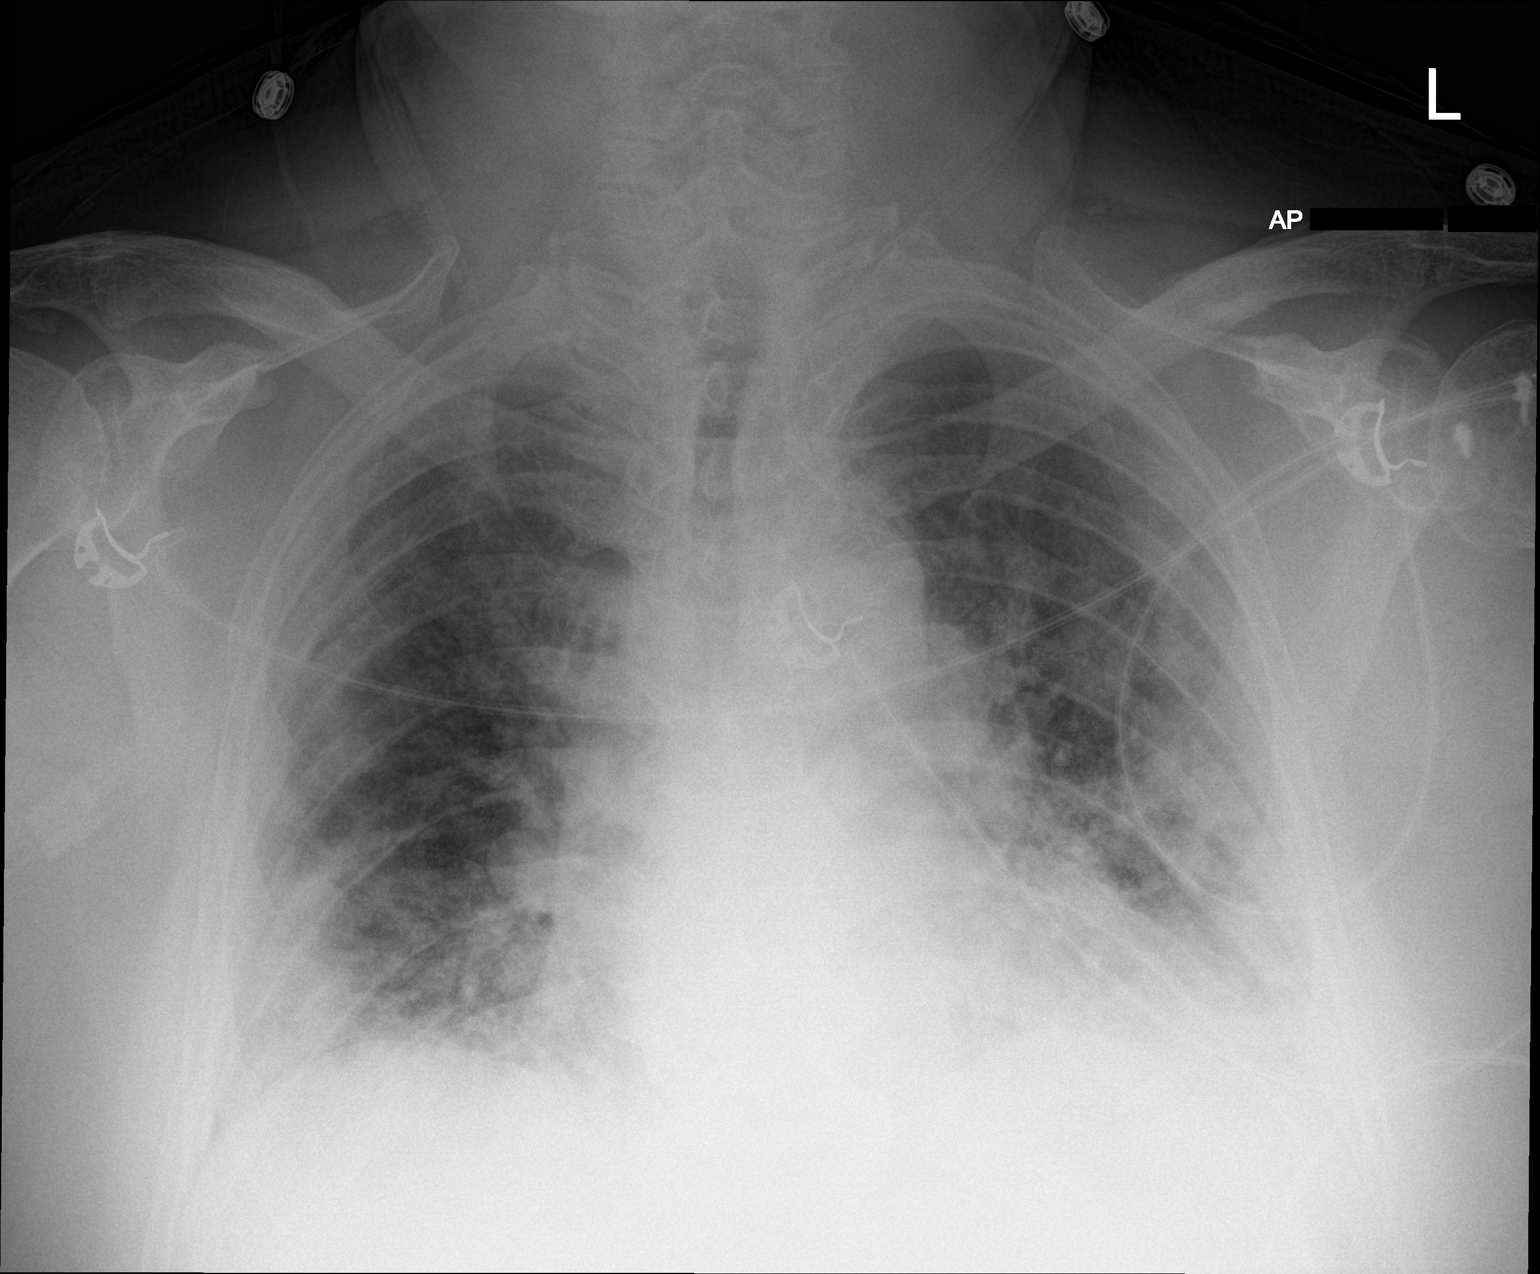

[1 of 1 positions shown; findings below may reference images not displayed]

FINDINGS: The cardiomediastinal silhouette is unchanged in contour. No pleural
effusion. No pneumothorax. Increased bilateral peripheral
predominant heterogeneous opacities, consistent with the sequela of
B7A5T-H7 infection. Visualized abdomen is unremarkable. Multilevel
degenerative changes of the thoracic spine. Status post LEFT rotator
cuff repair.
IMPRESSION: Increased bilateral peripheral predominant heterogeneous opacities,
consistent with the sequela of B7A5T-H7 infection.

## 2023-06-24 ENCOUNTER — Encounter: Payer: Self-pay | Admitting: Internal Medicine

## 2023-06-24 ENCOUNTER — Ambulatory Visit (INDEPENDENT_AMBULATORY_CARE_PROVIDER_SITE_OTHER): Payer: Medicare Other | Admitting: Internal Medicine

## 2023-06-24 VITALS — BP 134/78 | HR 79 | Temp 98.7°F | Ht 66.0 in | Wt 250.8 lb

## 2023-06-24 DIAGNOSIS — Z23 Encounter for immunization: Secondary | ICD-10-CM | POA: Diagnosis not present

## 2023-06-24 DIAGNOSIS — M1711 Unilateral primary osteoarthritis, right knee: Secondary | ICD-10-CM

## 2023-06-24 DIAGNOSIS — Z125 Encounter for screening for malignant neoplasm of prostate: Secondary | ICD-10-CM

## 2023-06-24 DIAGNOSIS — R7303 Prediabetes: Secondary | ICD-10-CM | POA: Diagnosis not present

## 2023-06-24 DIAGNOSIS — J4520 Mild intermittent asthma, uncomplicated: Secondary | ICD-10-CM | POA: Diagnosis not present

## 2023-06-24 DIAGNOSIS — I10 Essential (primary) hypertension: Secondary | ICD-10-CM | POA: Diagnosis not present

## 2023-06-24 DIAGNOSIS — E7800 Pure hypercholesterolemia, unspecified: Secondary | ICD-10-CM

## 2023-06-24 LAB — POCT GLYCOSYLATED HEMOGLOBIN (HGB A1C): Hemoglobin A1C: 6 % — AB (ref 4.0–5.6)

## 2023-06-24 LAB — CBC WITH DIFFERENTIAL/PLATELET
Basophils Absolute: 0.1 10*3/uL (ref 0.0–0.1)
Basophils Relative: 1.1 % (ref 0.0–3.0)
Eosinophils Absolute: 0.4 10*3/uL (ref 0.0–0.7)
Eosinophils Relative: 5.4 % — ABNORMAL HIGH (ref 0.0–5.0)
HCT: 48 % (ref 39.0–52.0)
Hemoglobin: 15.8 g/dL (ref 13.0–17.0)
Lymphocytes Relative: 30.1 % (ref 12.0–46.0)
Lymphs Abs: 2.2 10*3/uL (ref 0.7–4.0)
MCHC: 33 g/dL (ref 30.0–36.0)
MCV: 95.2 fl (ref 78.0–100.0)
Monocytes Absolute: 0.8 10*3/uL (ref 0.1–1.0)
Monocytes Relative: 10.8 % (ref 3.0–12.0)
Neutro Abs: 3.9 10*3/uL (ref 1.4–7.7)
Neutrophils Relative %: 52.6 % (ref 43.0–77.0)
Platelets: 223 10*3/uL (ref 150.0–400.0)
RBC: 5.04 Mil/uL (ref 4.22–5.81)
RDW: 14.3 % (ref 11.5–15.5)
WBC: 7.5 10*3/uL (ref 4.0–10.5)

## 2023-06-24 MED ORDER — ALBUTEROL SULFATE HFA 108 (90 BASE) MCG/ACT IN AERS
1.0000 | INHALATION_SPRAY | Freq: Four times a day (QID) | RESPIRATORY_TRACT | 3 refills | Status: DC | PRN
Start: 2023-06-24 — End: 2023-12-26

## 2023-06-24 MED ORDER — LISINOPRIL 5 MG PO TABS: 5.0000 mg | ORAL_TABLET | Freq: Every day | ORAL | 3 refills | Status: DC

## 2023-06-24 MED ORDER — METHYLPREDNISOLONE ACETATE 40 MG/ML IJ SUSP: 40.0000 mg | Freq: Once | INTRAMUSCULAR | Status: DC

## 2023-06-24 MED ORDER — METHYLPREDNISOLONE ACETATE 80 MG/ML IJ SUSP
40.0000 mg | Freq: Once | INTRAMUSCULAR | Status: AC
Start: 2023-06-24 — End: 2023-06-24
  Administered 2023-06-24: 40 mg

## 2023-06-24 MED ORDER — LIDOCAINE HCL 1 % IJ SOLN
1.0000 mL | Freq: Once | INTRAMUSCULAR | Status: AC
Start: 2023-06-24 — End: 2023-06-24
  Administered 2023-06-24: 1 mL

## 2023-06-24 NOTE — Patient Instructions (Addendum)
JOINT INJECTION POST CARE  Decreased weight bearing and activity for the next 48 hours.  Use ice for approximately 20 minutes on and off multipile times per day. Do no apply ice directly to the skin, wrap in a towel.  If leakage from site occurs, notify your provider.  Continue to use Ibuprofen or Tylenol as directed by your provider.  Please notify the provider if signs of redness, swelling, drainage, fever/chills or excessive pain occur.

## 2023-06-24 NOTE — Progress Notes (Unsigned)
[facility] [address] [place] [address] Dept: [phone] Dept Fax: [phone]  New Patient Office Visit  Subjective:   Thomas Hammond 1951-09-18 06/24/2023  Chief Complaint  Patient presents with   Establish Care    HPI: Thomas Hammond presents today to establish care at [facility]. This is not PCP's first encounter with patient. Patient was seen by PCP at prior location, [facility].  Concerns: See below   PRE-DIABETES: Thomas Hammond presents for the medical management of pre-diabetes.  Current medication: none Adhering to healthy low carb diet: yes Denies polydipsia, polyphagia, polyuria.  Lab Results  Component Value Date   HGBA1C 6.0 (A) 06/24/2023    HYPERTENSION: Thomas Hammond presents for the medical management of hypertension.  Patient's current hypertension medication regimen is: Lisinopril 5mg   Patient is NOT currently taking prescribed medications for HTN. Has been out of med x 1 month  Patient is  regularly keeping a check on BP at home.  Denies headache, dizziness, CP, SHOB, vision changes.   BP Readings from Last 3 Encounters:  06/24/23 134/78  05/27/21 103/64  05/09/21 108/66   HYPERLIPIDEMIA: Thomas Hammond presents for the medical management of hyperlipidemia.  Patient's current HLD regimen is: *** Patient is *** currently taking prescribed medications for HLD.  Adhering to heathy diet: *** Exercising regularly: *** Denies myalgias.  No results found for: "CHOL", "HDL", "LDLCALC", "LDLDIRECT", "TRIG", "CHOLHDL"   ARTHRITIS:  Knee right - joint injection  The following portions of the patient's history were reviewed and updated as appropriate: past medical history, past surgical history, family history, social history, allergies, medications, and problem list.   Patient Active Problem List   Diagnosis Date Noted   Elevated cholesterol 02/07/2022    Prediabetes 10/08/2021   Hypertension 09/21/2021   Primary osteoarthritis of right knee 09/21/2021   Mild intermittent asthma without complication 06/21/2021   Acute respiratory failure with hypoxia (HCC) 05/24/2021   Pneumonia due to COVID-19 virus 05/13/2021   Past Medical History:  Diagnosis Date   Asthma    Past Surgical History:  Procedure Laterality Date   CHOLECYSTECTOMY     ROTATOR CUFF REPAIR     History reviewed. No pertinent family history.  Current Outpatient Medications:    aspirin (BAYER ASPIRIN) 325 MG EC tablet, Take 325 mg by mouth every 8 (eight) hours as needed for pain., Disp: , Rfl:    benzonatate (TESSALON) 100 MG capsule, Take 100 mg by mouth 3 (three) times daily as needed for cough., Disp: , Rfl:    OVER THE COUNTER MEDICATION, Take 1 packet by mouth daily. Pack of vitamins from the natural food store, Disp: , Rfl:    albuterol (VENTOLIN HFA) 108 (90 Base) MCG/ACT inhaler, Inhale 1-2 puffs into the lungs every 6 (six) hours as needed for wheezing or shortness of breath., Disp: 18 g, Rfl: 3   lisinopril (ZESTRIL) 5 MG tablet, Take 1 tablet (5 mg total) by mouth daily., Disp: 90 tablet, Rfl: 3 No Known Allergies  ROS: A complete ROS was performed with pertinent positives/negatives noted in the HPI. The remainder of the ROS are negative.   Objective:   Today's Vitals   06/24/23 1324  BP: 134/78  Pulse: 79  Temp: 98.7 F (37.1 C)  TempSrc: Temporal  SpO2: 93%  Weight: 250 lb 12.8 oz (113.8 kg)  Height: 5\' 6"  (1.676 m)    GENERAL: Well-appearing, in NAD. Well nourished.  SKIN: Pink, warm and  dry. No rash, lesion, ulceration, or ecchymoses.  NECK: Trachea midline. Full ROM w/o pain or tenderness. No lymphadenopathy.  RESPIRATORY: Chest wall symmetrical. Respirations even and non-labored. Breath sounds clear to auscultation bilaterally.  CARDIAC: S1, S2 present, regular rate and rhythm. Peripheral pulses 2+ bilaterally.  MSK: Muscle tone and strength  appropriate for age. Joints w/o tenderness, redness, or swelling.  EXTREMITIES: Without clubbing, cyanosis, or edema.  NEUROLOGIC: No motor or sensory deficits. Steady, even gait.  PSYCH/MENTAL STATUS: Alert, oriented x 3. Cooperative, appropriate mood and affect.   KNEE JOINT INJECTION: Patient denies allergy to antiseptics (including iodine) and anesthetics.  Patient without h/o diabetes, frequent steroid use, use of blood thinners/ antiplatelets.  Patient was provided informed consent and a signed copy has been placed in the chart. Appropriate time out was taken. Area prepped and draped in usual sterile fashion. Anatomic landmarks were identified and injection site was marked.  Ethyl chloride spray was used to numb the area and 40mg  methylprednisolone plus 1 cc of 1% lidocaine without epinephrine was injected into the  knee using an anteromedial approach. The patient tolerated the procedure well and there were no immediate complications. Estimated blood loss is less than 5 cc.  Post procedure instructions were reviewed. Concerning signs and symptoms reviewed with patient, and instructed to seek immediate medical care if such signs/symptoms occur. Patient verbalized understanding with plan of care.    Health Maintenance Due  Topic Date Due   Colonoscopy  Never done   Zoster Vaccines- Shingrix (1 of 2) Never done   Diabetic kidney evaluation - eGFR measurement  05/25/2022   Medicare Annual Wellness (AWV)  09/21/2022    Results for orders placed or performed in visit on 06/24/23  POCT glycosylated hemoglobin (Hb A1C)  Result Value Ref Range   Hemoglobin A1C 6.0 (A) 4.0 - 5.6 %   HbA1c POC (<> result, manual entry)     HbA1c, POC (prediabetic range)     HbA1c, POC (controlled diabetic range)      Assessment & Plan:      Prediabetes -     POCT glycosylated hemoglobin (Hb A1C) -     CBC with Differential/Platelet  Primary hypertension -     CBC with Differential/Platelet -      Comprehensive metabolic panel -     TSH -     Lisinopril; Take 1 tablet (5 mg total) by mouth daily.  Dispense: 90 tablet; Refill: 3  Elevated cholesterol -     Lipid panel  Immunization due -     Pneumococcal conjugate vaccine 20-valent  Prostate cancer screening -     PSA  Mild intermittent asthma without complication -     Albuterol Sulfate HFA; Inhale 1-2 puffs into the lungs every 6 (six) hours as needed for wheezing or shortness of breath.  Dispense: 18 g; Refill: 3  Primary osteoarthritis of right knee -     methylPREDNISolone Acetate -     Lidocaine HCl     Return in about 3 months (around 09/23/2023) for Chronic Condition follow up.   Salvatore Decent, FNP

## 2023-06-25 LAB — COMPREHENSIVE METABOLIC PANEL
ALT: 39 U/L (ref 0–53)
AST: 41 U/L — ABNORMAL HIGH (ref 0–37)
Albumin: 4.1 g/dL (ref 3.5–5.2)
Alkaline Phosphatase: 80 U/L (ref 39–117)
BUN: 15 mg/dL (ref 6–23)
CO2: 26 meq/L (ref 19–32)
Calcium: 9.7 mg/dL (ref 8.4–10.5)
Chloride: 102 meq/L (ref 96–112)
Creatinine, Ser: 0.97 mg/dL (ref 0.40–1.50)
GFR: 78.42 mL/min (ref >60.00)
Glucose, Bld: 106 mg/dL — ABNORMAL HIGH (ref 70–99)
Potassium: 4.7 meq/L (ref 3.5–5.1)
Sodium: 137 meq/L (ref 135–145)
Total Bilirubin: 0.6 mg/dL (ref 0.2–1.2)
Total Protein: 7.8 g/dL (ref 6.0–8.3)

## 2023-06-25 LAB — LIPID PANEL
Cholesterol: 218 mg/dL — ABNORMAL HIGH (ref 0–200)
HDL: 33.9 mg/dL — ABNORMAL LOW (ref >39.00)
LDL Cholesterol: 144 mg/dL — ABNORMAL HIGH (ref 0–99)
NonHDL: 184.24
Total CHOL/HDL Ratio: 6
Triglycerides: 202 mg/dL — ABNORMAL HIGH (ref 0.0–149.0)
VLDL: 40.4 mg/dL — ABNORMAL HIGH (ref 0.0–40.0)

## 2023-06-25 LAB — PSA: PSA: 3.37 ng/mL (ref 0.10–4.00)

## 2023-06-25 LAB — TSH: TSH: 3.38 u[IU]/mL (ref 0.35–5.50)

## 2023-06-30 ENCOUNTER — Telehealth: Payer: Self-pay | Admitting: Internal Medicine

## 2023-06-30 NOTE — Telephone Encounter (Signed)
Returned your call about his lab results.

## 2023-06-30 NOTE — Telephone Encounter (Signed)
Results have been relayed to the patient. The patient verbalized understanding. No questions at this time.   

## 2023-07-02 ENCOUNTER — Other Ambulatory Visit: Payer: Self-pay | Admitting: Internal Medicine

## 2023-07-02 DIAGNOSIS — E7800 Pure hypercholesterolemia, unspecified: Secondary | ICD-10-CM

## 2023-07-02 MED ORDER — ATORVASTATIN CALCIUM 20 MG PO TABS
20.0000 mg | ORAL_TABLET | Freq: Every day | ORAL | 1 refills | Status: DC
Start: 2023-07-02 — End: 2023-12-22

## 2023-08-04 ENCOUNTER — Ambulatory Visit (INDEPENDENT_AMBULATORY_CARE_PROVIDER_SITE_OTHER): Payer: Medicare Other

## 2023-08-04 VITALS — BP 108/60 | HR 75 | Temp 98.9°F | Ht 68.5 in | Wt 248.6 lb

## 2023-08-04 DIAGNOSIS — Z0000 Encounter for general adult medical examination without abnormal findings: Secondary | ICD-10-CM

## 2023-08-04 NOTE — Progress Notes (Signed)
Subjective:   Thomas Hammond is a 72 y.o. male who presents for Medicare Annual/Subsequent preventive examination.  Visit Complete: In person  .  Cardiac Risk Factors include: advanced age (>1men, >83 women);hypertension;male gender     Objective:    Today's Vitals   08/04/23 1315  BP: 108/60  Pulse: 75  Temp: 98.9 F (37.2 C)  TempSrc: Oral  SpO2: 93%  Weight: 248 lb 9.6 oz (112.8 kg)  Height: 5' 8.5" (1.74 m)   Body mass index is 37.25 kg/m.     08/04/2023    1:23 PM 05/14/2021    3:29 PM 05/14/2021    3:00 PM 05/13/2021    9:34 PM 05/09/2021   12:54 PM  Advanced Directives  Does Patient Have a Medical Advance Directive? Yes  No No Yes  Type of Forensic scientist of [employer] Power of Attorney  Copy of Healthcare Power of Attorney in Chart? No - copy requested      Would patient like information on creating a medical advance directive?  Yes (Inpatient - patient requests chaplain consult to create a medical advance directive)       Current Medications (verified) Outpatient Encounter Medications as of 08/04/2023  Medication Sig   albuterol (VENTOLIN HFA) 108 (90 Base) MCG/ACT inhaler Inhale 1-2 puffs into the lungs every 6 (six) hours as needed for wheezing or shortness of breath.   aspirin (BAYER ASPIRIN) 325 MG EC tablet Take 325 mg by mouth every 8 (eight) hours as needed for pain.   aspirin EC 81 MG tablet Take 81 mg by mouth daily. Swallow whole.   atorvastatin (LIPITOR) 20 MG tablet Take 1 tablet (20 mg total) by mouth daily.   benzonatate (TESSALON) 100 MG capsule Take 100 mg by mouth 3 (three) times daily as needed for cough.   lisinopril (ZESTRIL) 5 MG tablet Take 1 tablet (5 mg total) by mouth daily.   OVER THE COUNTER MEDICATION Take 1 packet by mouth daily. Pack of vitamins from the natural food store   No facility-administered encounter medications on file as of 08/04/2023.    Allergies  (verified) Patient has no known allergies.   History: Past Medical History:  Diagnosis Date   Asthma    Past Surgical History:  Procedure Laterality Date   CHOLECYSTECTOMY     ROTATOR CUFF REPAIR     History reviewed. No pertinent family history. Social History   Socioeconomic History   Marital status: Single    Spouse name: Not on file   Number of children: Not on file   Years of education: Not on file   Highest education level: Not on file  Occupational History   Not on file  Tobacco Use   Smoking status: Never   Smokeless tobacco: Never  Vaping Use   Vaping status: Never Used  Substance and Sexual Activity   Alcohol use: Never   Drug use: Never   Sexual activity: Not on file  Other Topics Concern   Not on file  Social History Narrative   Not on file   Social Determinants of Health   Financial Resource Strain: Low Risk  (08/04/2023)   Overall Financial Resource Strain (CARDIA)    Difficulty of Paying Living Expenses: Not hard at all  Food Insecurity: No Food Insecurity (08/04/2023)   Hunger Vital Sign    Worried About Running Out of Food in the Last Year: Never true    Ran Out of Food  in the Last Year: Never true  Transportation Needs: No Transportation Needs (08/04/2023)   PRAPARE - Administrator, Civil Service (Medical): No    Lack of Transportation (Non-Medical): No  Physical Activity: Inactive (08/04/2023)   Exercise Vital Sign    Days of Exercise per Week: 0 days    Minutes of Exercise per Session: 0 min  Stress: No Stress Concern Present (08/04/2023)   [employer] of Occupational Health - Occupational Stress Questionnaire    Feeling of Stress : Not at all  Social Connections: Socially Isolated (08/04/2023)   Social Connection and Isolation Panel [NHANES]    Frequency of Communication with Friends and Family: More than three times a week    Frequency of Social Gatherings with Friends and Family: More than three times a week     Attends Religious Services: Never    Database administrator or Organizations: No    Attends Engineer, structural: Never    Marital Status: Divorced    Tobacco Counseling Counseling given: Not Answered   Clinical Intake:  Pre-visit preparation completed: Yes  Pain : No/denies pain     Nutritional Status: BMI > 30  Obese Nutritional Risks: None Diabetes: No  How often do you need to have someone help you when you read instructions, pamphlets, or other written materials from your doctor or pharmacy?: 1 - Never  Interpreter Needed?: No  Information entered by :: NAllen LPN   Activities of Daily Living    08/04/2023    1:17 PM  In your present state of health, do you have any difficulty performing the following activities:  Hearing? 0  Vision? 0  Difficulty concentrating or making decisions? 0  Walking or climbing stairs? 0  Dressing or bathing? 0  Doing errands, shopping? 0  Preparing Food and eating ? N  Using the Toilet? N  In the past six months, have you accidently leaked urine? N  Do you have problems with loss of bowel control? N  Managing your Medications? N  Managing your Finances? N  Housekeeping or managing your Housekeeping? N    Patient Care Team: Salvatore Decent, FNP as PCP - General (Internal Medicine)  Indicate any recent Medical Services you may have received from other than [facility] providers in the past year (date may be approximate).     Assessment:   This is a routine wellness examination for Thomas Hammond.  Hearing/Vision screen Hearing Screening - Comments:: Denies hearing issues Vision Screening - Comments:: No regular eye exams   Goals Addressed             This Visit's Progress    Patient Stated       08/04/2023, keep going       Depression Screen    08/04/2023    1:25 PM 06/24/2023    1:27 PM  PHQ 2/9 Scores  PHQ - 2 Score 0 1  PHQ- 9 Score 3     Fall Risk    08/04/2023    1:24 PM 06/24/2023    1:27 PM  Fall Risk    Falls in the past year? 0 0  Number falls in past yr: 0 0  Injury with Fall? 0 0  Risk for fall due to : No Fall Risks;Medication side effect No Fall Risks  Follow up Falls prevention discussed;Falls evaluation completed Falls evaluation completed    MEDICARE RISK AT HOME: Medicare Risk at Home Any stairs in or around the home?: Yes If so,  are there any without handrails?: No Home free of loose throw rugs in walkways, pet beds, electrical cords, etc?: Yes Adequate lighting in your home to reduce risk of falls?: Yes Life alert?: No Use of a cane, walker or w/c?: No Grab bars in the bathroom?: No Shower chair or bench in shower?: No Elevated toilet seat or a handicapped toilet?: Yes  TIMED UP AND GO:  Was the test performed?  Yes  Length of time to ambulate 10 feet: 5 sec Gait steady and fast without use of assistive device    Cognitive Function:        08/04/2023    1:26 PM  6CIT Screen  What Year? 0 points  What month? 0 points  What time? 0 points  Count back from 20 0 points  Months in reverse 0 points  Repeat phrase 0 points  Total Score 0 points    Immunizations Immunization History  Administered Date(s) Administered   Influenza, High Dose Seasonal PF 07/21/2013   Influenza,inj,Quad PF,6+ Mos 08/30/2013   PNEUMOCOCCAL CONJUGATE-20 06/24/2023   Pneumococcal Polysaccharide-23 05/11/2013   Tdap 06/28/2014    TDAP status: Up to date  Flu Vaccine status: Declined, Education has been provided regarding the importance of this vaccine but patient still declined. Advised may receive this vaccine at local pharmacy or Health Dept. Aware to provide a copy of the vaccination record if obtained from local pharmacy or Health Dept. Verbalized acceptance and understanding.  Pneumococcal vaccine status: Up to date  Covid-19 vaccine status: Declined, Education has been provided regarding the importance of this vaccine but patient still declined. Advised may receive this  vaccine at local pharmacy or Health Dept.or vaccine clinic. Aware to provide a copy of the vaccination record if obtained from local pharmacy or Health Dept. Verbalized acceptance and understanding.  Qualifies for Shingles Vaccine? Yes   Zostavax completed No   Shingrix Completed?: No.    Education has been provided regarding the importance of this vaccine. Patient has been advised to call insurance company to determine out of pocket expense if they have not yet received this vaccine. Advised may also receive vaccine at local pharmacy or Health Dept. Verbalized acceptance and understanding.  Screening Tests Health Maintenance  Topic Date Due   Colonoscopy  Never done   COVID-19 Vaccine (1 - 2023-24 season) 08/20/2023 (Originally 06/22/2023)   Zoster Vaccines- Shingrix (1 of 2) 11/04/2023 (Originally 10/03/2001)   INFLUENZA VACCINE  01/19/2024 (Originally 05/22/2023)   Diabetic kidney evaluation - Urine ACR  06/23/2028 (Originally 10/03/1969)   HEMOGLOBIN A1C  12/22/2023   Diabetic kidney evaluation - eGFR measurement  06/23/2024   DTaP/Tdap/Td (2 - Td or Tdap) 06/28/2024   Medicare Annual Wellness (AWV)  08/03/2024   Pneumonia Vaccine 43+ Years old  Completed   Hepatitis C Screening  Completed   HPV VACCINES  Aged Out   FOOT EXAM  Discontinued   OPHTHALMOLOGY EXAM  Discontinued    Health Maintenance  Health Maintenance Due  Topic Date Due   Colonoscopy  Never done    Colorectal cancer screening: declines   Lung Cancer Screening: (Low Dose CT Chest recommended if Age 26-80 years, 20 pack-year currently smoking OR have quit w/in 15years.) does not qualify.   Lung Cancer Screening Referral: no  Additional Screening:  Hepatitis C Screening: does qualify; Completed 05/21/2021  Vision Screening: Recommended annual ophthalmology exams for early detection of glaucoma and other disorders of the eye. Is the patient up to date with their annual eye  exam?  No  Who is the provider or what is  the name of the office in which the patient attends annual eye exams? none If pt is not established with a provider, would they like to be referred to a provider to establish care? No .   Dental Screening: Recommended annual dental exams for proper oral hygiene  Diabetic Foot Exam: n/a  Community Resource Referral / Chronic Care Management: CRR required this visit?  No   CCM required this visit?  No     Plan:     I have personally reviewed and noted the following in the patient's chart:   Medical and social history Use of alcohol, tobacco or illicit drugs  Current medications and supplements including opioid prescriptions. Patient is not currently taking opioid prescriptions. Functional ability and status Nutritional status Physical activity Advanced directives List of other physicians Hospitalizations, surgeries, and ER visits in previous 12 months Vitals Screenings to include cognitive, depression, and falls Referrals and appointments  In addition, I have reviewed and discussed with patient certain preventive protocols, quality metrics, and best practice recommendations. A written personalized care plan for preventive services as well as general preventive health recommendations were provided to patient.     Barb Merino, LPN   16/07/9603   After Visit Summary: (In Person-Printed) AVS printed and given to the patient  Nurse Notes: none

## 2023-08-04 NOTE — Patient Instructions (Signed)
Thomas Hammond , Thank you for taking time to come for your Medicare Wellness Visit. I appreciate your ongoing commitment to your health goals. Please review the following plan we discussed and let me know if I can assist you in the future.   Referrals/Orders/Follow-Ups/Clinician Recommendations: none  This is a list of the screening recommended for you and due dates:  Health Maintenance  Topic Date Due   Colon Cancer Screening  Never done   COVID-19 Vaccine (1 - 2023-24 season) 08/20/2023*   Zoster (Shingles) Vaccine (1 of 2) 11/04/2023*   Flu Shot  01/19/2024*   Yearly kidney health urinalysis for diabetes  06/23/2028*   Hemoglobin A1C  12/22/2023   Yearly kidney function blood test for diabetes  06/23/2024   DTaP/Tdap/Td vaccine (2 - Td or Tdap) 06/28/2024   Medicare Annual Wellness Visit  08/03/2024   Pneumonia Vaccine  Completed   Hepatitis C Screening  Completed   HPV Vaccine  Aged Out   Complete foot exam   Discontinued   Eye exam for diabetics  Discontinued  *Topic was postponed. The date shown is not the original due date.    Advanced directives: (Copy Requested) Please bring a copy of your health care power of attorney and living will to the office to be added to your chart at your convenience.  Next Medicare Annual Wellness Visit scheduled for next year: Yes  insert Preventive Care attachment Insert FALL PREVENTION attachment if needed

## 2023-09-22 NOTE — Progress Notes (Unsigned)
[facility] [address] [place] [address] Dept: [phone] Dept Fax: [phone]    Subjective:   Makeen Greenough 1951/02/14 09/23/2023  No chief complaint on file.   HPI: Thomas Hammond presents today for re-assessment and management of chronic medical conditions. Discussed the use of AI scribe software for clinical note transcription with the patient, who gave verbal consent to proceed.  History of Present Illness            The following portions of the patient's history were reviewed and updated as appropriate: past medical history, past surgical history, family history, social history, allergies, medications, and problem list.   Patient Active Problem List   Diagnosis Date Noted   Elevated cholesterol 02/07/2022   Prediabetes 10/08/2021   Hypertension 09/21/2021   Primary osteoarthritis of right knee 09/21/2021   Mild intermittent asthma without complication 06/21/2021   Acute respiratory failure with hypoxia (HCC) 05/24/2021   Pneumonia due to COVID-19 virus 05/13/2021   Past Medical History:  Diagnosis Date   Asthma    Past Surgical History:  Procedure Laterality Date   CHOLECYSTECTOMY     ROTATOR CUFF REPAIR     No family history on file.  Current Outpatient Medications:    albuterol (VENTOLIN HFA) 108 (90 Base) MCG/ACT inhaler, Inhale 1-2 puffs into the lungs every 6 (six) hours as needed for wheezing or shortness of breath., Disp: 18 g, Rfl: 3   aspirin (BAYER ASPIRIN) 325 MG EC tablet, Take 325 mg by mouth every 8 (eight) hours as needed for pain., Disp: , Rfl:    aspirin EC 81 MG tablet, Take 81 mg by mouth daily. Swallow whole., Disp: , Rfl:    atorvastatin (LIPITOR) 20 MG tablet, Take 1 tablet (20 mg total) by mouth daily., Disp: 90 tablet, Rfl: 1   benzonatate (TESSALON) 100 MG capsule, Take 100 mg by mouth 3 (three) times daily as needed for cough., Disp: , Rfl:    lisinopril (ZESTRIL) 5 MG  tablet, Take 1 tablet (5 mg total) by mouth daily., Disp: 90 tablet, Rfl: 3   OVER THE COUNTER MEDICATION, Take 1 packet by mouth daily. Pack of vitamins from the natural food store, Disp: , Rfl:  No Known Allergies   ROS: A complete ROS was performed with pertinent positives/negatives noted in the HPI. The remainder of the ROS are negative.    Objective:   There were no vitals filed for this visit.  GENERAL: Well-appearing, in NAD. Well nourished.  SKIN: Pink, warm and dry. No rash, lesion, ulceration, or ecchymoses.  NECK: Trachea midline. Full ROM w/o pain or tenderness. No lymphadenopathy.  RESPIRATORY: Chest wall symmetrical. Respirations even and non-labored. Breath sounds clear to auscultation bilaterally.  CARDIAC: S1, S2 present, regular rate and rhythm. Peripheral pulses 2+ bilaterally.  MSK: Muscle tone and strength appropriate for age. Joints w/o tenderness, redness, or swelling.  EXTREMITIES: Without clubbing, cyanosis, or edema.  NEUROLOGIC: No motor or sensory deficits. Steady, even gait.  PSYCH/MENTAL STATUS: Alert, oriented x 3. Cooperative, appropriate mood and affect.   Health Maintenance Due  Topic Date Due   Colonoscopy  Never done   COVID-19 Vaccine (1 - 2023-24 season) Never done    No results found for any visits on 09/23/23.  The 10-year ASCVD risk score (Arnett DK, et al., 2019) is: 35.9%     Assessment & Plan:  Assessment and Plan            There are  no diagnoses linked to this encounter. No orders of the defined types were placed in this encounter.  No images are attached to the encounter or orders placed in the encounter. No orders of the defined types were placed in this encounter.   No follow-ups on file.   Salvatore Decent, FNP

## 2023-09-23 ENCOUNTER — Ambulatory Visit (INDEPENDENT_AMBULATORY_CARE_PROVIDER_SITE_OTHER): Payer: Medicare Other | Admitting: Internal Medicine

## 2023-09-23 ENCOUNTER — Encounter: Payer: Self-pay | Admitting: Internal Medicine

## 2023-09-23 VITALS — BP 136/78 | HR 81 | Temp 98.5°F | Ht 68.5 in | Wt 253.8 lb

## 2023-09-23 DIAGNOSIS — E7800 Pure hypercholesterolemia, unspecified: Secondary | ICD-10-CM

## 2023-09-23 DIAGNOSIS — J069 Acute upper respiratory infection, unspecified: Secondary | ICD-10-CM

## 2023-09-23 DIAGNOSIS — J4520 Mild intermittent asthma, uncomplicated: Secondary | ICD-10-CM

## 2023-09-23 DIAGNOSIS — R7303 Prediabetes: Secondary | ICD-10-CM

## 2023-09-23 DIAGNOSIS — I10 Essential (primary) hypertension: Secondary | ICD-10-CM | POA: Diagnosis not present

## 2023-09-23 LAB — COMPREHENSIVE METABOLIC PANEL
ALT: 46 U/L (ref 0–53)
AST: 47 U/L — ABNORMAL HIGH (ref 0–37)
Albumin: 4.1 g/dL (ref 3.5–5.2)
Alkaline Phosphatase: 80 U/L (ref 39–117)
BUN: 15 mg/dL (ref 6–23)
CO2: 27 meq/L (ref 19–32)
Calcium: 9.2 mg/dL (ref 8.4–10.5)
Chloride: 102 meq/L (ref 96–112)
Creatinine, Ser: 0.8 mg/dL (ref 0.40–1.50)
GFR: 88.75 mL/min (ref >60.00)
Glucose, Bld: 153 mg/dL — ABNORMAL HIGH (ref 70–99)
Potassium: 4 meq/L (ref 3.5–5.1)
Sodium: 137 meq/L (ref 135–145)
Total Bilirubin: 0.9 mg/dL (ref 0.2–1.2)
Total Protein: 7.7 g/dL (ref 6.0–8.3)

## 2023-09-23 LAB — LIPID PANEL
Cholesterol: 145 mg/dL (ref 0–200)
HDL: 31.1 mg/dL — ABNORMAL LOW (ref >39.00)
LDL Cholesterol: 74 mg/dL (ref 0–99)
NonHDL: 114.37
Total CHOL/HDL Ratio: 5
Triglycerides: 200 mg/dL — ABNORMAL HIGH (ref 0.0–149.0)
VLDL: 40 mg/dL (ref 0.0–40.0)

## 2023-09-23 LAB — HEMOGLOBIN A1C: Hgb A1c MFr Bld: 6.7 % — ABNORMAL HIGH (ref 4.6–6.5)

## 2023-09-23 MED ORDER — BENZONATATE 100 MG PO CAPS
100.0000 mg | ORAL_CAPSULE | Freq: Three times a day (TID) | ORAL | 0 refills | Status: DC | PRN
Start: 2023-09-23 — End: 2024-05-31

## 2023-09-23 MED ORDER — FLUTICASONE PROPIONATE 50 MCG/ACT NA SUSP: 2.0000 | Freq: Every day | NASAL | 1 refills | Status: AC

## 2023-09-23 NOTE — Patient Instructions (Addendum)
Rest, drink plenty of fluids.  Tylenol for fever, body aches.   For cough: Use albuterol inhaler every 4-6 hours as needed for shortness of breat or wheezing   Use cough tablets as prescribed   For nasal and sinus congestion: Use Flonase: 2 nasal sprays on each side of the nose in the morning until you feel better  Patients with high blood pressure can use Coricidin HBP for decongestant, it will not raise your blood pressure.   Call if not gradually better over the next  10 days  Call anytime if the symptoms are severe, you have high fever, short of breath, chest pain

## 2023-09-25 ENCOUNTER — Encounter: Payer: Self-pay | Admitting: Internal Medicine

## 2023-09-25 ENCOUNTER — Other Ambulatory Visit: Payer: Self-pay | Admitting: Internal Medicine

## 2023-09-25 ENCOUNTER — Telehealth: Payer: Self-pay

## 2023-09-25 DIAGNOSIS — J069 Acute upper respiratory infection, unspecified: Secondary | ICD-10-CM

## 2023-09-25 MED ORDER — AZITHROMYCIN 250 MG PO TABS: ORAL_TABLET | ORAL | 0 refills | Status: AC

## 2023-09-25 NOTE — Telephone Encounter (Signed)
Called patient to review lab results,  patient states he still feels bad and would like to know if an Rx could be sent to the pharmacy

## 2023-09-25 NOTE — Telephone Encounter (Signed)
I have sent in Zpack for him

## 2023-12-20 ENCOUNTER — Other Ambulatory Visit: Payer: Self-pay | Admitting: Internal Medicine

## 2023-12-20 DIAGNOSIS — E7800 Pure hypercholesterolemia, unspecified: Secondary | ICD-10-CM

## 2023-12-26 ENCOUNTER — Other Ambulatory Visit: Payer: Self-pay | Admitting: Internal Medicine

## 2023-12-26 DIAGNOSIS — J4520 Mild intermittent asthma, uncomplicated: Secondary | ICD-10-CM

## 2024-01-20 NOTE — Progress Notes (Unsigned)
 [facility]ARE [facility] [address] [place] [address] Dept: [phone] Dept Fax: [phone]    Subjective:   Thomas Hammond 11-07-1950 01/22/2024  No chief complaint on file.   HPI: Thomas Hammond presents today for re-assessment and management of chronic medical conditions.  Discussed the use of AI scribe software for clinical note transcription with the patient, who gave verbal consent to proceed.  History of Present Illness        The following portions of the patient's history were reviewed and updated as appropriate: past medical history, past surgical history, family history, social history, allergies, medications, and problem list.   Patient Active Problem List   Diagnosis Date Noted   Elevated cholesterol 02/07/2022   Prediabetes 10/08/2021   Hypertension 09/21/2021   Primary osteoarthritis of right knee 09/21/2021   Mild intermittent asthma without complication 06/21/2021   Acute respiratory failure with hypoxia (HCC) 05/24/2021   Pneumonia due to COVID-19 virus 05/13/2021   Past Medical History:  Diagnosis Date   Asthma    Past Surgical History:  Procedure Laterality Date   CHOLECYSTECTOMY     ROTATOR CUFF REPAIR     No family history on file.  Current Outpatient Medications:    albuterol (VENTOLIN HFA) 108 (90 Base) MCG/ACT inhaler, INHALE 1 TO 2 PUFFS INTO LUNGS EVERY 6 HOURS AS NEEDED FOR WHEEZING OR SHORTNESS OF BREATH, Disp: 18 g, Rfl: 0   aspirin EC 81 MG tablet, Take 81 mg by mouth daily. Swallow whole., Disp: , Rfl:    atorvastatin (LIPITOR) 20 MG tablet, Take 1 tablet by mouth once daily, Disp: 90 tablet, Rfl: 0   benzonatate (TESSALON) 100 MG capsule, Take 1 capsule (100 mg total) by mouth 3 (three) times daily as needed for cough., Disp: 30 capsule, Rfl: 0   fluticasone (FLONASE) 50 MCG/ACT nasal spray, Place 2 sprays into both nostrils daily., Disp: 16 g, Rfl: 1   lisinopril (ZESTRIL) 5 MG  tablet, Take 1 tablet (5 mg total) by mouth daily., Disp: 90 tablet, Rfl: 3 No Known Allergies   ROS: A complete ROS was performed with pertinent positives/negatives noted in the HPI. The remainder of the ROS are negative.    Objective:   There were no vitals filed for this visit.  GENERAL: Well-appearing, in NAD. Well nourished.  SKIN: Pink, warm and dry. No rash, lesion, ulceration, or ecchymoses.  NECK: Trachea midline. Full ROM w/o pain or tenderness. No lymphadenopathy.  RESPIRATORY: Chest wall symmetrical. Respirations even and non-labored. Breath sounds clear to auscultation bilaterally.  CARDIAC: S1, S2 present, regular rate and rhythm. Peripheral pulses 2+ bilaterally.  MSK: Muscle tone and strength appropriate for age. Joints w/o tenderness, redness, or swelling.  EXTREMITIES: Without clubbing, cyanosis, or edema.  NEUROLOGIC: No motor or sensory deficits. Steady, even gait.  PSYCH/MENTAL STATUS: Alert, oriented x 3. Cooperative, appropriate mood and affect.   Health Maintenance Due  Topic Date Due   Zoster Vaccines- Shingrix (1 of 2) Never done   COVID-19 Vaccine (1 - 2024-25 season) Never done    No results found for any visits on 01/22/24.  The 10-year ASCVD risk score (Arnett DK, et al., 2019) is: 45.9%     Assessment & Plan:  Assessment and Plan Assessment & Plan      Primary hypertension  Prediabetes  Elevated cholesterol  Primary osteoarthritis of right knee  Mild intermittent asthma without complication   No orders of the defined types were placed in this encounter.  No images are attached to the encounter or orders placed in the encounter. No orders of the defined types were placed in this encounter.   No follow-ups on file.   Salvatore Decent, FNP

## 2024-01-22 ENCOUNTER — Ambulatory Visit (INDEPENDENT_AMBULATORY_CARE_PROVIDER_SITE_OTHER): Payer: Medicare Other | Admitting: Internal Medicine

## 2024-01-22 ENCOUNTER — Encounter: Payer: Self-pay | Admitting: Internal Medicine

## 2024-01-22 ENCOUNTER — Ambulatory Visit: Payer: Medicare Other | Admitting: Internal Medicine

## 2024-01-22 VITALS — BP 130/70 | HR 77 | Temp 98.7°F | Ht 68.5 in | Wt 256.2 lb

## 2024-01-22 DIAGNOSIS — R7303 Prediabetes: Secondary | ICD-10-CM

## 2024-01-22 DIAGNOSIS — M1711 Unilateral primary osteoarthritis, right knee: Secondary | ICD-10-CM | POA: Diagnosis not present

## 2024-01-22 DIAGNOSIS — I10 Essential (primary) hypertension: Secondary | ICD-10-CM

## 2024-01-22 DIAGNOSIS — E7800 Pure hypercholesterolemia, unspecified: Secondary | ICD-10-CM

## 2024-01-22 DIAGNOSIS — E119 Type 2 diabetes mellitus without complications: Secondary | ICD-10-CM | POA: Diagnosis not present

## 2024-01-22 DIAGNOSIS — R200 Anesthesia of skin: Secondary | ICD-10-CM

## 2024-01-22 DIAGNOSIS — R202 Paresthesia of skin: Secondary | ICD-10-CM

## 2024-01-22 DIAGNOSIS — J4520 Mild intermittent asthma, uncomplicated: Secondary | ICD-10-CM

## 2024-01-22 LAB — COMPREHENSIVE METABOLIC PANEL WITH GFR
ALT: 32 U/L (ref 0–53)
AST: 36 U/L (ref 0–37)
Albumin: 4.3 g/dL (ref 3.5–5.2)
Alkaline Phosphatase: 82 U/L (ref 39–117)
BUN: 15 mg/dL (ref 6–23)
CO2: 26 meq/L (ref 19–32)
Calcium: 9.3 mg/dL (ref 8.4–10.5)
Chloride: 101 meq/L (ref 96–112)
Creatinine, Ser: 0.83 mg/dL (ref 0.40–1.50)
GFR: 87.57 mL/min (ref >60.00)
Glucose, Bld: 133 mg/dL — ABNORMAL HIGH (ref 70–99)
Potassium: 4.5 meq/L (ref 3.5–5.1)
Sodium: 135 meq/L (ref 135–145)
Total Bilirubin: 0.7 mg/dL (ref 0.2–1.2)
Total Protein: 7.7 g/dL (ref 6.0–8.3)

## 2024-01-22 LAB — POCT GLYCOSYLATED HEMOGLOBIN (HGB A1C): Hemoglobin A1C: 6.7 % — AB (ref 4.0–5.6)

## 2024-01-22 LAB — LIPID PANEL
Cholesterol: 152 mg/dL (ref 0–200)
HDL: 41.5 mg/dL (ref >39.00)
LDL Cholesterol: 87 mg/dL (ref 0–99)
NonHDL: 110.96
Total CHOL/HDL Ratio: 4
Triglycerides: 119 mg/dL (ref 0.0–149.0)
VLDL: 23.8 mg/dL (ref 0.0–40.0)

## 2024-01-22 MED ORDER — MELOXICAM 15 MG PO TABS: 15.0000 mg | ORAL_TABLET | Freq: Every day | ORAL | 0 refills | Status: DC | PRN

## 2024-01-22 NOTE — Patient Instructions (Addendum)
   If you do not notice any improvement of your hands with the medication and splint over the next couple weeks, call me and I will refer you to orthopedics.

## 2024-02-15 ENCOUNTER — Other Ambulatory Visit: Payer: Self-pay | Admitting: Internal Medicine

## 2024-02-15 DIAGNOSIS — R200 Anesthesia of skin: Secondary | ICD-10-CM

## 2024-02-16 NOTE — Telephone Encounter (Signed)
 Last Ov 01/22/24 Filled 01/22/24

## 2024-03-21 ENCOUNTER — Other Ambulatory Visit: Payer: Self-pay | Admitting: Internal Medicine

## 2024-03-21 DIAGNOSIS — E7800 Pure hypercholesterolemia, unspecified: Secondary | ICD-10-CM

## 2024-05-24 ENCOUNTER — Ambulatory Visit: Admitting: Internal Medicine

## 2024-05-31 ENCOUNTER — Encounter: Payer: Self-pay | Admitting: Internal Medicine

## 2024-05-31 ENCOUNTER — Ambulatory Visit (INDEPENDENT_AMBULATORY_CARE_PROVIDER_SITE_OTHER): Admitting: Internal Medicine

## 2024-05-31 VITALS — BP 118/66 | HR 71 | Temp 98.3°F | Ht 68.5 in | Wt 259.6 lb

## 2024-05-31 DIAGNOSIS — E7800 Pure hypercholesterolemia, unspecified: Secondary | ICD-10-CM | POA: Diagnosis not present

## 2024-05-31 DIAGNOSIS — J4520 Mild intermittent asthma, uncomplicated: Secondary | ICD-10-CM

## 2024-05-31 DIAGNOSIS — R053 Chronic cough: Secondary | ICD-10-CM

## 2024-05-31 DIAGNOSIS — M1711 Unilateral primary osteoarthritis, right knee: Secondary | ICD-10-CM

## 2024-05-31 DIAGNOSIS — I10 Essential (primary) hypertension: Secondary | ICD-10-CM | POA: Diagnosis not present

## 2024-05-31 DIAGNOSIS — E119 Type 2 diabetes mellitus without complications: Secondary | ICD-10-CM | POA: Diagnosis not present

## 2024-05-31 DIAGNOSIS — R200 Anesthesia of skin: Secondary | ICD-10-CM

## 2024-05-31 DIAGNOSIS — R202 Paresthesia of skin: Secondary | ICD-10-CM

## 2024-05-31 LAB — COMPREHENSIVE METABOLIC PANEL WITH GFR
ALT: 33 U/L (ref 0–53)
AST: 30 U/L (ref 0–37)
Albumin: 4.2 g/dL (ref 3.5–5.2)
Alkaline Phosphatase: 78 U/L (ref 39–117)
BUN: 19 mg/dL (ref 6–23)
CO2: 21 meq/L (ref 19–32)
Calcium: 9.1 mg/dL (ref 8.4–10.5)
Chloride: 103 meq/L (ref 96–112)
Creatinine, Ser: 0.72 mg/dL (ref 0.40–1.50)
GFR: 91.18 mL/min (ref >60.00)
Glucose, Bld: 146 mg/dL — ABNORMAL HIGH (ref 70–99)
Potassium: 4 meq/L (ref 3.5–5.1)
Sodium: 135 meq/L (ref 135–145)
Total Bilirubin: 0.6 mg/dL (ref 0.2–1.2)
Total Protein: 7.7 g/dL (ref 6.0–8.3)

## 2024-05-31 LAB — LIPID PANEL
Cholesterol: 163 mg/dL (ref 0–200)
HDL: 39.5 mg/dL (ref >39.00)
LDL Cholesterol: 85 mg/dL (ref 0–99)
NonHDL: 123.93
Total CHOL/HDL Ratio: 4
Triglycerides: 194 mg/dL — ABNORMAL HIGH (ref 0.0–149.0)
VLDL: 38.8 mg/dL (ref 0.0–40.0)

## 2024-05-31 LAB — HEMOGLOBIN A1C: Hgb A1c MFr Bld: 7.2 % — ABNORMAL HIGH (ref 4.6–6.5)

## 2024-05-31 MED ORDER — BENZONATATE 100 MG PO CAPS: 100.0000 mg | ORAL_CAPSULE | Freq: Three times a day (TID) | ORAL | 2 refills | Status: AC | PRN

## 2024-05-31 NOTE — Progress Notes (Signed)
 [facility]ARE [facility] [address] [place] [address] Dept: [phone] Dept Fax: [phone]    Subjective:   Thomas Hammond 1951-03-26 05/31/2024  Chief Complaint  Patient presents with   Follow-up   Tingling    In both hands     HPI: Thomas Hammond presents today for re-assessment and management of chronic medical conditions.  Discussed the use of AI scribe software for clinical note transcription with the patient, who gave verbal consent to proceed.  History of Present Illness   Thomas Hammond is a 73 year old male with diabetes and hypertension who presents for routine follow-up and evaluation of hand tingling.  He experiences ongoing tingling and 'stinging feeling' in both hands, sometimes affecting one hand at a time. The symptoms are intermittent and can occur when holding objects like a pencil or when lying down. The tingling sometimes resolves when he hangs his hands over the side of the bed. No recent injuries to the hands and no neck pain. Previous treatments with meloxicam  and splints have not provided relief.  He manages his HLD and hypertension with atorvastatin  and lisinopril , respectively. He has not eaten this morning but has taken his medications with water. He prefers diet and exercise to manage his diabetes currently.   He reports that his arthritis, particularly in the knee, has improved following previous injections and he currently has no issues with it.  He mentions a [facility], for which he uses an inhaler.   He is up to date on his vaccinations, including TDAP and pneumonia, Declines colonoscopy.   He requests a refill for a cough medication.  He discusses his [facility] benefits and concerns about potential costs for hearing and vision appointments, as he does not qualify for coverage under his current disability rating.      The following portions of the patient's history  were reviewed and updated as appropriate: past medical history, past surgical history, family history, social history, allergies, medications, and problem list.   Patient Active Problem List   Diagnosis Date Noted   Elevated cholesterol 02/07/2022   Type 2 diabetes mellitus without complications (HCC) 10/08/2021   Hypertension 09/21/2021   Primary osteoarthritis of right knee 09/21/2021   Mild intermittent asthma without complication 06/21/2021   Acute respiratory failure with hypoxia (HCC) 05/24/2021   Pneumonia due to COVID-19 virus 05/13/2021   Past Medical History:  Diagnosis Date   Asthma    Past Surgical History:  Procedure Laterality Date   CHOLECYSTECTOMY     ROTATOR CUFF REPAIR     History reviewed. No pertinent family history.  Current Outpatient Medications:    albuterol  (VENTOLIN  HFA) 108 (90 Base) MCG/ACT inhaler, INHALE 1 TO 2 PUFFS INTO LUNGS EVERY 6 HOURS AS NEEDED FOR WHEEZING OR SHORTNESS OF BREATH, Disp: 18 g, Rfl: 0   aspirin EC 81 MG tablet, Take 81 mg by mouth daily. Swallow whole., Disp: , Rfl:    atorvastatin  (LIPITOR) 20 MG tablet, Take 1 tablet by mouth once daily, Disp: 90 tablet, Rfl: 0   fluticasone  (FLONASE ) 50 MCG/ACT nasal spray, Place 2 sprays into both nostrils daily., Disp: 16 g, Rfl: 1   lisinopril  (ZESTRIL ) 5 MG tablet, Take 1 tablet (5 mg total) by mouth daily., Disp: 90 tablet, Rfl: 3   meloxicam  (MOBIC ) 15 MG tablet, TAKE 1 TABLET BY MOUTH ONCE DAILY AS NEEDED FOR PAIN, Disp: 30 tablet, Rfl: 0   benzonatate  (TESSALON ) 100  MG capsule, Take 1 capsule (100 mg total) by mouth 3 (three) times daily as needed for cough., Disp: 30 capsule, Rfl: 2 No Known Allergies   ROS: A complete ROS was performed with pertinent positives/negatives noted in the HPI. The remainder of the ROS are negative.    Objective:   Today's Vitals   05/31/24 0915  BP: 118/66  Pulse: 71  Temp: 98.3 F (36.8 C)  TempSrc: Temporal  SpO2: 96%  Weight: 259 lb 9.6 oz  (117.8 kg)  Height: 5' 8.5 (1.74 m)    GENERAL: Well-appearing, in NAD. Well nourished.  SKIN: Pink, warm and dry. No rash, lesion, ulceration, or ecchymoses.  NECK: Trachea midline. Full ROM w/o pain or tenderness. No lymphadenopathy.  RESPIRATORY: Chest wall symmetrical. Respirations even and non-labored. Breath sounds clear to auscultation bilaterally.  CARDIAC: S1, S2 present, regular rate and rhythm. Peripheral pulses 2+ bilaterally.  MSK: Muscle tone and strength appropriate for age. Joints w/o tenderness, redness, or swelling.  EXTREMITIES: Without clubbing, cyanosis, or edema.  NEUROLOGIC: No motor or sensory deficits. Steady, even gait.  PSYCH/MENTAL STATUS: Alert, oriented x 3. Cooperative, appropriate mood and affect.   There are no preventive care reminders to display for this patient.  No results found for any visits on 05/31/24.  The 10-year ASCVD risk score (Arnett DK, et al., 2019) is: 35.3%     Assessment & Plan:  Assessment and Plan    Type 2 diabetes mellitus Diabetes management ongoing with regular glucose monitoring. - Check A1C and microalbumin urine  Hyperlipidemia Managed with atorvastatin  and regular cholesterol monitoring. - Continue atorvastatin . - Check Lipid panel   Hypertension Well-controlled with lisinopril . - Continue lisinopril  5 mg once daily. - CMP   Knee osteoarthritis Well-managed with previous injections. - Tylenol  as needed  Hand paresthesia, possible nerve compression Intermittent tingling and numbness in hands. Previous treatments ineffective with meloxicam  and cock up splints.  - Refer to orthopedist for further evaluation. - Consider x-rays or nerve conduction study.  Asthma Managed with inhalers. No recent exacerbations.   - Continue albuterol  PRN.  - Rx for tessalon  perles for chronic cough   Orders Placed This Encounter  Procedures   Comprehensive metabolic panel with GFR   Hemoglobin A1c   Lipid panel    Microalbumin / creatinine urine ratio    Standing Status:   Future    Expiration Date:   05/31/2025   Ambulatory referral to Orthopedics    Referral Priority:   Routine    Referral Type:   Consultation    Number of Visits Requested:   1   No images are attached to the encounter or orders placed in the encounter. Meds ordered this encounter  Medications   benzonatate  (TESSALON ) 100 MG capsule    Sig: Take 1 capsule (100 mg total) by mouth 3 (three) times daily as needed for cough.    Dispense:  30 capsule    Refill:  2    Supervising Provider:   THOMPSON, AARON B [[id number]]    Return in about 6 months (around 12/01/2024) for Chronic Condition follow up.   Rosina Senters, FNP

## 2024-06-02 ENCOUNTER — Ambulatory Visit: Payer: Self-pay | Admitting: Internal Medicine

## 2024-06-08 ENCOUNTER — Other Ambulatory Visit: Payer: Self-pay | Admitting: Internal Medicine

## 2024-06-08 DIAGNOSIS — I10 Essential (primary) hypertension: Secondary | ICD-10-CM

## 2024-06-18 ENCOUNTER — Other Ambulatory Visit: Payer: Self-pay | Admitting: Internal Medicine

## 2024-06-18 DIAGNOSIS — E7800 Pure hypercholesterolemia, unspecified: Secondary | ICD-10-CM

## 2024-08-06 ENCOUNTER — Ambulatory Visit (INDEPENDENT_AMBULATORY_CARE_PROVIDER_SITE_OTHER): Payer: Medicare Other

## 2024-08-06 VITALS — BP 138/76 | HR 67 | Temp 98.3°F | Ht 69.5 in | Wt 263.6 lb

## 2024-08-06 DIAGNOSIS — Z0000 Encounter for general adult medical examination without abnormal findings: Secondary | ICD-10-CM | POA: Diagnosis not present

## 2024-08-06 NOTE — Progress Notes (Signed)
 Subjective:   Thomas Hammond is a 73 y.o. who presents for a Medicare Wellness preventive visit.  As a reminder, Annual Wellness Visits don't include a physical exam, and some assessments may be limited, especially if this visit is performed virtually. We may recommend an in-person follow-up visit with your provider if needed.  Visit Complete: In person    Persons Participating in Visit: Patient.  AWV Hammond: No: Patient Medicare AWV Hammond was not completed prior to this visit.  Cardiac Risk Factors include: advanced age (>25men, >4 women);hypertension;male gender;obesity (BMI >30kg/m2)     Objective:    Today's Vitals   08/06/24 0807  BP: 138/76  Pulse: 67  Temp: 98.3 F (36.8 C)  TempSrc: Oral  SpO2: 95%  Weight: 263 lb 9.6 oz (119.6 kg)  Height: 5' 9.5 (1.765 m)  PainSc: 3    Body mass index is 38.37 kg/m.     08/06/2024    8:13 AM 08/04/2023    1:23 PM 05/14/2021    3:29 PM 05/14/2021    3:00 PM 05/13/2021    9:34 PM 05/09/2021   12:54 PM  Advanced Directives  Does Patient Have a Medical Advance Directive? Yes Yes  No No Yes  Type of Estate agent of [place];Living will Healthcare Power of [employer] Power of [employer] Power of Attorney  Copy of Healthcare Power of Attorney in Chart? No - copy requested No - copy requested      Would patient like information on creating a medical advance directive?   Yes (Inpatient - patient requests chaplain consult to create a medical advance directive)       Current Medications (verified) Outpatient Encounter Medications as of 08/06/2024  Medication Sig   albuterol  (VENTOLIN  HFA) 108 (90 Base) MCG/ACT inhaler INHALE 1 TO 2 PUFFS INTO LUNGS EVERY 6 HOURS AS NEEDED FOR WHEEZING OR SHORTNESS OF BREATH   aspirin EC 81 MG tablet Take 81 mg by mouth daily. Swallow whole.   atorvastatin  (LIPITOR) 20 MG tablet Take 1 tablet by mouth once daily   benzonatate  (TESSALON ) 100 MG  capsule Take 1 capsule (100 mg total) by mouth 3 (three) times daily as needed for cough.   fluticasone  (FLONASE ) 50 MCG/ACT nasal spray Place 2 sprays into both nostrils daily.   lisinopril  (ZESTRIL ) 5 MG tablet Take 1 tablet by mouth once daily   meloxicam  (MOBIC ) 15 MG tablet TAKE 1 TABLET BY MOUTH ONCE DAILY AS NEEDED FOR PAIN   No facility-administered encounter medications on file as of 08/06/2024.    Allergies (verified) Patient has no known allergies.   History: Past Medical History:  Diagnosis Date   Asthma    Past Surgical History:  Procedure Laterality Date   CHOLECYSTECTOMY     ROTATOR CUFF REPAIR     History reviewed. No pertinent family history. Social History   Socioeconomic History   Marital status: Single    Spouse name: Not on file   Number of children: Not on file   Years of education: Not on file   Highest education level: Not on file  Occupational History   Not on file  Tobacco Use   Smoking status: Never   Smokeless tobacco: Never  Vaping Use   Vaping status: Never Used  Substance and Sexual Activity   Alcohol use: Never   Drug use: Never   Sexual activity: Not on file  Other Topics Concern   Not on file  Social History Narrative  Not on file   Social Drivers of Health   Financial Resource Strain: Low Risk  (08/06/2024)   Overall Financial Resource Strain (CARDIA)    Difficulty of Paying Living Expenses: Not hard at all  Food Insecurity: No Food Insecurity (08/06/2024)   Hunger Vital Sign    Worried About Running Out of Food in the Last Year: Never true    Ran Out of Food in the Last Year: Never true  Transportation Needs: No Transportation Needs (08/06/2024)   PRAPARE - Administrator, Civil Service (Medical): No    Lack of Transportation (Non-Medical): No  Physical Activity: Inactive (08/06/2024)   Exercise Vital Sign    Days of Exercise per Week: 0 days    Minutes of Exercise per Session: 0 min  Stress: No Stress  Concern Present (08/06/2024)   Thomas Hammond    Feeling of Stress: Not at all  Social Connections: Socially Isolated (08/06/2024)   Social Connection and Isolation Panel    Frequency of Communication with Friends and Family: More than three times a week    Frequency of Social Gatherings with Friends and Family: More than three times a week    Attends Religious Services: Never    Database administrator or Organizations: No    Attends Engineer, structural: Never    Marital Status: Divorced    Tobacco Counseling Counseling given: Not Answered    Clinical Intake:  Pre-visit preparation completed: Yes  Pain : 0-10 Pain Score: 3  Pain Type: Chronic pain Pain Location: Hip Pain Orientation: Right Pain Descriptors / Indicators: Shooting Pain Onset: More than a month ago Pain Frequency: Intermittent     Nutritional Status: BMI > 30  Obese Nutritional Risks: None Diabetes: No  Lab Results  Component Value Date   HGBA1C 7.2 (H) 05/31/2024   HGBA1C 6.7 (A) 01/22/2024   HGBA1C 6.7 (H) 09/23/2023     How often do you need to have someone help you when you read instructions, pamphlets, or other written materials from your doctor or pharmacy?: 1 - Never  Interpreter Needed?: No  Information entered by :: NAllen LPN   Activities of Daily Living     08/06/2024    8:09 AM  In your present state of health, do you have any difficulty performing the following activities:  Hearing? 0  Vision? 0  Difficulty concentrating or making decisions? 0  Walking or climbing stairs? 0  Dressing or bathing? 0  Doing errands, shopping? 0  Preparing Food and eating ? N  Using the Toilet? N  In the past six months, have you accidently leaked urine? N  Do you have problems with loss of bowel control? N  Managing your Medications? N  Managing your Finances? N  Housekeeping or managing your Housekeeping? N    Patient  Care Team: Billy Knee, FNP as PCP - General (Internal Medicine)  I have updated your Care Teams any recent Medical Services you may have received from other providers in the past year.     Assessment:   This is a routine wellness examination for Thomas Hammond.  Hearing/Vision screen Hearing Screening - Comments:: Denies hearing issues Vision Screening - Comments:: [facility]   Goals Addressed             This Visit's Progress    Patient Stated       08/06/2024, lose weight       Depression  Screen     08/06/2024    8:14 AM 05/31/2024    9:14 AM 01/22/2024    8:12 AM 09/23/2023    8:06 AM 08/04/2023    1:25 PM 06/24/2023    1:27 PM  PHQ 2/9 Scores  PHQ - 2 Score 0 0 0 0 0 1  PHQ- 9 Score 3    3     Fall Risk     08/06/2024    8:14 AM 05/31/2024    9:14 AM 01/22/2024    8:11 AM 09/23/2023    8:06 AM 08/04/2023    1:24 PM  Fall Risk   Falls in the past year? 0 0 0 0 0  Number falls in past yr: 0 0 0 0 0  Injury with Fall? 0 0 0 0 0  Risk for fall due to : Medication side effect No Fall Risks No Fall Risks No Fall Risks No Fall Risks;Medication side effect  Follow up Falls evaluation completed;Falls prevention discussed Falls prevention discussed Falls prevention discussed Falls prevention discussed Falls prevention discussed;Falls evaluation completed    MEDICARE RISK AT HOME:  Medicare Risk at Home Any stairs in or around the home?: Yes If so, are there any without handrails?: No Home free of loose throw rugs in walkways, pet beds, electrical cords, etc?: Yes Adequate lighting in your home to reduce risk of falls?: Yes Life alert?: No Use of a cane, walker or w/c?: No Grab bars in the bathroom?: No Shower chair or bench in shower?: No Elevated toilet seat or a handicapped toilet?: Yes  TIMED UP AND GO:  Was the test performed?  Yes  Length of time to ambulate 10 feet: 5 sec Gait steady and fast without use of assistive device  Cognitive Function: 6CIT  completed        08/06/2024    8:15 AM 08/04/2023    1:26 PM  6CIT Screen  What Year? 0 points 0 points  What month? 0 points 0 points  What time? 0 points 0 points  Count back from 20 0 points 0 points  Months in reverse 0 points 0 points  Repeat phrase 0 points 0 points  Total Score 0 points 0 points    Immunizations Immunization History  Administered Date(s) Administered   INFLUENZA, HIGH DOSE SEASONAL PF 07/21/2013   Influenza,inj,Quad PF,6+ Mos 08/30/2013   PNEUMOCOCCAL CONJUGATE-20 06/24/2023   Pneumococcal Polysaccharide-23 05/11/2013   Tdap 06/28/2014    Screening Tests Health Maintenance  Topic Date Due   COVID-19 Vaccine (1) Never done   Influenza Vaccine  05/21/2024   DTaP/Tdap/Td (2 - Td or Tdap) 06/28/2024   Zoster Vaccines- Shingrix (1 of 2) 08/31/2024 (Originally 10/03/1970)   Colonoscopy  09/22/2024 (Originally 10/03/1996)   Diabetic kidney evaluation - Urine ACR  06/23/2028 (Originally 10/03/1969)   HEMOGLOBIN A1C  12/01/2024   Diabetic kidney evaluation - eGFR measurement  05/31/2025   Medicare Annual Wellness (AWV)  08/06/2025   Pneumococcal Vaccine: 50+ Years  Completed   Hepatitis C Screening  Completed   Meningococcal B Vaccine  Aged Out   FOOT EXAM  Discontinued   OPHTHALMOLOGY EXAM  Discontinued    Health Maintenance Items Addressed: Vaccines Due: TDAP  Additional Screening:  Vision Screening: Recommended annual ophthalmology exams for early detection of glaucoma and other disorders of the eye. Is the patient up to date with their annual eye exam?  Yes  Who is the provider or what is the name of the office in  which the patient [facility]  Dental Screening: Recommended annual dental exams for proper oral hygiene  Community Resource Referral / Chronic Care Management: CRR required this visit?  No   CCM required this visit?  No   Plan:    I have personally reviewed and noted the following in the patient's chart:    Medical and social history Use of alcohol, tobacco or illicit drugs  Current medications and supplements including opioid prescriptions. Patient is not currently taking opioid prescriptions. Functional ability and status Nutritional status Physical activity Advanced directives List of other physicians Hospitalizations, surgeries, and ER visits in previous 12 months Vitals Screenings to include cognitive, depression, and falls Referrals and appointments  In addition, I have reviewed and discussed with patient certain preventive protocols, quality metrics, and best practice recommendations. A written personalized care plan for preventive services as well as general preventive health recommendations were provided to patient.   Ardella FORBES Dawn, LPN   [id number]   After Visit Summary: (In Person-Printed) AVS printed and given to the patient  Notes: Nothing significant to report at this time.

## 2024-08-06 NOTE — Patient Instructions (Addendum)
 Thomas Hammond,  Thank you for taking the time for your Medicare Wellness Visit. I appreciate your continued commitment to your health goals. Please review the care plan we discussed, and feel free to reach out if I can assist you further.  Medicare recommends these wellness visits once per year to help you and your care team stay ahead of potential health issues. These visits are designed to focus on prevention, allowing your provider to concentrate on managing your acute and chronic conditions during your regular appointments.  Please note that Annual Wellness Visits do not include a physical exam. Some assessments may be limited, especially if the visit was conducted virtually. If needed, we may recommend a separate in-person follow-up with your provider.  Ongoing Care Seeing your primary care provider every 3 to 6 months helps us  monitor your health and provide consistent, personalized care.   Referrals If a referral was made during today's visit and you haven't received any updates within two weeks, please contact the referred provider directly to check on the status.  Recommended Screenings:  Health Maintenance  Topic Date Due   COVID-19 Vaccine (1) Never done   Flu Shot  05/21/2024   DTaP/Tdap/Td vaccine (2 - Td or Tdap) 06/28/2024   Zoster (Shingles) Vaccine (1 of 2) 08/31/2024*   Colon Cancer Screening  09/22/2024*   Yearly kidney health urinalysis for diabetes  06/23/2028*   Hemoglobin A1C  12/01/2024   Yearly kidney function blood test for diabetes  05/31/2025   Medicare Annual Wellness Visit  08/06/2025   Pneumococcal Vaccine for age over 37  Completed   Hepatitis C Screening  Completed   Meningitis B Vaccine  Aged Out   Complete foot exam   Discontinued   Eye exam for diabetics  Discontinued  *Topic was postponed. The date shown is not the original due date.       08/06/2024    8:13 AM  Advanced Directives  Does Patient Have a Medical Advance Directive? Yes  Type of  Estate agent of [place];Living will  Copy of Healthcare Power of Attorney in Chart? No - copy requested   Advance Care Planning is important because it: Ensures you receive medical care that aligns with your values, goals, and preferences. Provides guidance to your family and loved ones, reducing the emotional burden of decision-making during critical moments.  Vision: Annual vision screenings are recommended for early detection of glaucoma, cataracts, and diabetic retinopathy. These exams can also reveal signs of chronic conditions such as diabetes and high blood pressure.  Dental: Annual dental screenings help detect early signs of oral cancer, gum disease, and other conditions linked to overall health, including heart disease and diabetes.  Please see the attached documents for additional preventive care recommendations.

## 2024-09-06 ENCOUNTER — Other Ambulatory Visit: Payer: Self-pay | Admitting: Internal Medicine

## 2024-09-06 DIAGNOSIS — I10 Essential (primary) hypertension: Secondary | ICD-10-CM

## 2024-09-06 NOTE — Telephone Encounter (Signed)
 Refill request for Lisinopril  5mg  LOV 05/31/2024 FOV 12/02/2024 Last refill 06/08/2024 Med pending for approval.

## 2024-09-17 ENCOUNTER — Other Ambulatory Visit: Payer: Self-pay | Admitting: Internal Medicine

## 2024-09-17 DIAGNOSIS — E7800 Pure hypercholesterolemia, unspecified: Secondary | ICD-10-CM

## 2024-12-02 ENCOUNTER — Ambulatory Visit: Admitting: Internal Medicine

## 2025-08-12 ENCOUNTER — Ambulatory Visit
